# Patient Record
Sex: Male | Born: 1940 | Race: White | Hispanic: No | Smoking: Never smoker
Health system: Southern US, Community
[De-identification: ages and names within clinical notes are randomized; demographics above are authoritative.]

## PROBLEM LIST (undated history)

## (undated) DIAGNOSIS — E785 Hyperlipidemia, unspecified: Secondary | ICD-10-CM

## (undated) HISTORY — PX: APPENDECTOMY: SHX54

---

## 2009-07-04 ENCOUNTER — Encounter (INDEPENDENT_AMBULATORY_CARE_PROVIDER_SITE_OTHER): Payer: Self-pay | Admitting: *Deleted

## 2009-08-08 ENCOUNTER — Ambulatory Visit: Payer: Self-pay | Admitting: Gastroenterology

## 2009-08-16 ENCOUNTER — Telehealth: Payer: Self-pay | Admitting: Gastroenterology

## 2009-08-22 ENCOUNTER — Ambulatory Visit: Payer: Self-pay | Admitting: Gastroenterology

## 2009-08-22 ENCOUNTER — Encounter: Payer: Self-pay | Admitting: Gastroenterology

## 2009-08-23 ENCOUNTER — Encounter: Payer: Self-pay | Admitting: Gastroenterology

## 2012-08-02 ENCOUNTER — Encounter: Payer: Self-pay | Admitting: Gastroenterology

## 2013-02-10 ENCOUNTER — Encounter: Payer: Self-pay | Admitting: Gastroenterology

## 2015-05-07 ENCOUNTER — Encounter: Payer: Self-pay | Admitting: Gastroenterology

## 2016-08-19 DEATH — deceased

## 2018-01-25 ENCOUNTER — Emergency Department (HOSPITAL_COMMUNITY): Payer: Medicare Other

## 2018-01-25 ENCOUNTER — Other Ambulatory Visit: Payer: Self-pay

## 2018-01-25 ENCOUNTER — Emergency Department (HOSPITAL_COMMUNITY): Payer: Medicare Other | Admitting: Certified Registered Nurse Anesthetist

## 2018-01-25 ENCOUNTER — Ambulatory Visit (HOSPITAL_COMMUNITY)
Admission: EM | Admit: 2018-01-25 | Discharge: 2018-01-25 | Disposition: A | Discharge status: Home / Self Care | Payer: Medicare Other | Attending: Emergency Medicine | Admitting: Emergency Medicine

## 2018-01-25 ENCOUNTER — Encounter (HOSPITAL_COMMUNITY)
Admission: EM | Disposition: A | Discharge status: Home / Self Care | Payer: Self-pay | Source: Home / Self Care | Attending: Emergency Medicine

## 2018-01-25 ENCOUNTER — Encounter (HOSPITAL_COMMUNITY): Payer: Self-pay

## 2018-01-25 DIAGNOSIS — Z79899 Other long term (current) drug therapy: Secondary | ICD-10-CM | POA: Diagnosis not present

## 2018-01-25 DIAGNOSIS — E785 Hyperlipidemia, unspecified: Secondary | ICD-10-CM | POA: Insufficient documentation

## 2018-01-25 DIAGNOSIS — W312XXA Contact with powered woodworking and forming machines, initial encounter: Secondary | ICD-10-CM | POA: Insufficient documentation

## 2018-01-25 DIAGNOSIS — S62635B Displaced fracture of distal phalanx of left ring finger, initial encounter for open fracture: Secondary | ICD-10-CM | POA: Diagnosis not present

## 2018-01-25 DIAGNOSIS — S62631B Displaced fracture of distal phalanx of left index finger, initial encounter for open fracture: Secondary | ICD-10-CM | POA: Diagnosis not present

## 2018-01-25 DIAGNOSIS — S62621B Displaced fracture of medial phalanx of left index finger, initial encounter for open fracture: Secondary | ICD-10-CM | POA: Diagnosis not present

## 2018-01-25 DIAGNOSIS — S62633B Displaced fracture of distal phalanx of left middle finger, initial encounter for open fracture: Secondary | ICD-10-CM | POA: Diagnosis not present

## 2018-01-25 DIAGNOSIS — T148XXA Other injury of unspecified body region, initial encounter: Secondary | ICD-10-CM

## 2018-01-25 HISTORY — PX: MINOR IRRIGATION AND DEBRIDEMENT OF WOUND: SHX6239

## 2018-01-25 HISTORY — DX: Hyperlipidemia, unspecified: E78.5

## 2018-01-25 LAB — I-STAT CHEM 8, ED
BUN: 19 mg/dL (ref 6–20)
CREATININE: 0.9 mg/dL (ref 0.61–1.24)
Calcium, Ion: 1.15 mmol/L (ref 1.15–1.40)
Chloride: 106 mmol/L (ref 101–111)
Glucose, Bld: 108 mg/dL — ABNORMAL HIGH (ref 65–99)
HEMATOCRIT: 45 % (ref 39.0–52.0)
HEMOGLOBIN: 15.3 g/dL (ref 13.0–17.0)
POTASSIUM: 4 mmol/L (ref 3.5–5.1)
SODIUM: 142 mmol/L (ref 135–145)
TCO2: 25 mmol/L (ref 22–32)

## 2018-01-25 SURGERY — MINOR IRRIGATION AND DEBRIDEMENT OF WOUND
Anesthesia: Monitor Anesthesia Care | Site: Hand | Laterality: Left

## 2018-01-25 MED ORDER — SULFAMETHOXAZOLE-TRIMETHOPRIM 800-160 MG PO TABS: 1.0000 | ORAL_TABLET | Freq: Two times a day (BID) | ORAL | 0 refills | Status: DC

## 2018-01-25 MED ORDER — POVIDONE-IODINE 10 % EX SWAB: 2.0000 "application " | Freq: Once | CUTANEOUS | Status: DC

## 2018-01-25 MED ORDER — BUPIVACAINE HCL 0.25 % IJ SOLN: 20.0000 mL | Freq: Once | INTRAMUSCULAR | Status: DC

## 2018-01-25 MED ORDER — FENTANYL CITRATE (PF) 100 MCG/2ML IJ SOLN
50.0000 ug | Freq: Once | INTRAMUSCULAR | Status: AC
  Administered 2018-01-25: 50 ug via INTRAVENOUS
  Filled 2018-01-25: qty 2

## 2018-01-25 MED ORDER — CHLORHEXIDINE GLUCONATE 4 % EX LIQD: 60.0000 mL | Freq: Once | CUTANEOUS | Status: DC

## 2018-01-25 MED ORDER — 0.9 % SODIUM CHLORIDE (POUR BTL) OPTIME
TOPICAL | Status: DC | PRN
  Administered 2018-01-25 (×2): 1000 mL

## 2018-01-25 MED ORDER — FENTANYL CITRATE (PF) 100 MCG/2ML IJ SOLN: 25.0000 ug | INTRAMUSCULAR | Status: DC | PRN

## 2018-01-25 MED ORDER — PROPOFOL 1000 MG/100ML IV EMUL
INTRAVENOUS | Status: AC
  Filled 2018-01-25: qty 100

## 2018-01-25 MED ORDER — HYDROCODONE-ACETAMINOPHEN 5-325 MG PO TABS: ORAL_TABLET | ORAL | 0 refills | Status: DC

## 2018-01-25 MED ORDER — ONDANSETRON HCL 4 MG/2ML IJ SOLN
INTRAMUSCULAR | Status: AC
  Filled 2018-01-25: qty 2

## 2018-01-25 MED ORDER — FENTANYL CITRATE (PF) 250 MCG/5ML IJ SOLN
INTRAMUSCULAR | Status: AC
  Filled 2018-01-25: qty 5

## 2018-01-25 MED ORDER — SODIUM CHLORIDE 0.9 % IV SOLN: 1.5000 g | Freq: Three times a day (TID) | INTRAVENOUS | Status: DC

## 2018-01-25 MED ORDER — PROPOFOL 500 MG/50ML IV EMUL
INTRAVENOUS | Status: DC | PRN
  Administered 2018-01-25: 100 ug/kg/min via INTRAVENOUS

## 2018-01-25 MED ORDER — SODIUM CHLORIDE 0.9 % IV SOLN
1.5000 g | Freq: Once | INTRAVENOUS | Status: AC
  Administered 2018-01-25: 1.5 g via INTRAVENOUS
  Filled 2018-01-25: qty 1.5

## 2018-01-25 MED ORDER — LIDOCAINE-EPINEPHRINE (PF) 1.5 %-1:200000 IJ SOLN
INTRAMUSCULAR | Status: DC | PRN
  Administered 2018-01-25: 10 mL via PERINEURAL

## 2018-01-25 MED ORDER — FENTANYL CITRATE (PF) 250 MCG/5ML IJ SOLN
INTRAMUSCULAR | Status: DC | PRN
  Administered 2018-01-25: 50 ug via INTRAVENOUS

## 2018-01-25 MED ORDER — LIDOCAINE HCL 2 % IJ SOLN
20.0000 mL | Freq: Once | INTRAMUSCULAR | Status: AC
  Administered 2018-01-25: 400 mg
  Filled 2018-01-25: qty 20

## 2018-01-25 MED ORDER — LACTATED RINGERS IV SOLN
INTRAVENOUS | Status: DC | PRN
  Administered 2018-01-25: 18:00:00 via INTRAVENOUS

## 2018-01-25 MED ORDER — LACTATED RINGERS IV SOLN
INTRAVENOUS | Status: DC
  Administered 2018-01-25: 18:00:00 via INTRAVENOUS

## 2018-01-25 MED ORDER — DEXAMETHASONE SODIUM PHOSPHATE 10 MG/ML IJ SOLN
INTRAMUSCULAR | Status: AC
  Filled 2018-01-25: qty 1

## 2018-01-25 MED ORDER — PROMETHAZINE HCL 25 MG/ML IJ SOLN: 6.2500 mg | INTRAMUSCULAR | Status: DC | PRN

## 2018-01-25 MED ORDER — BUPIVACAINE HCL (PF) 0.25 % IJ SOLN
INTRAMUSCULAR | Status: AC
  Filled 2018-01-25: qty 10

## 2018-01-25 MED ORDER — PROPOFOL 10 MG/ML IV BOLUS
INTRAVENOUS | Status: AC
  Filled 2018-01-25: qty 20

## 2018-01-25 MED ORDER — ONDANSETRON HCL 4 MG/2ML IJ SOLN
INTRAMUSCULAR | Status: DC | PRN
  Administered 2018-01-25: 4 mg via INTRAVENOUS

## 2018-01-25 MED ORDER — PROPOFOL 10 MG/ML IV BOLUS
INTRAVENOUS | Status: DC | PRN
  Administered 2018-01-25 (×2): 10 mg via INTRAVENOUS

## 2018-01-25 MED ORDER — ROPIVACAINE HCL 7.5 MG/ML IJ SOLN
INTRAMUSCULAR | Status: DC | PRN
  Administered 2018-01-25: 30 mL via PERINEURAL

## 2018-01-25 MED ORDER — BUPIVACAINE HCL (PF) 0.25 % IJ SOLN
20.0000 mL | Freq: Once | INTRAMUSCULAR | Status: AC
  Administered 2018-01-25: 20 mL
  Filled 2018-01-25: qty 30

## 2018-01-25 MED ORDER — DEXAMETHASONE SODIUM PHOSPHATE 10 MG/ML IJ SOLN
INTRAMUSCULAR | Status: DC | PRN
  Administered 2018-01-25: 4 mg via INTRAVENOUS

## 2018-01-25 SURGICAL SUPPLY — 60 items
BANDAGE ACE 3X5.8 VEL STRL LF (GAUZE/BANDAGES/DRESSINGS) ×3 IMPLANT
BANDAGE ACE 4X5 VEL STRL LF (GAUZE/BANDAGES/DRESSINGS) ×3 IMPLANT
BANDAGE COBAN STERILE 2 (GAUZE/BANDAGES/DRESSINGS) IMPLANT
BANDAGE ELASTIC 3 VELCRO ST LF (GAUZE/BANDAGES/DRESSINGS) ×3 IMPLANT
BANDAGE ELASTIC 4 VELCRO ST LF (GAUZE/BANDAGES/DRESSINGS) ×3 IMPLANT
BENZOIN TINCTURE PRP APPL 2/3 (GAUZE/BANDAGES/DRESSINGS) ×3 IMPLANT
BLADE CLIPPER SURG (BLADE) ×3 IMPLANT
BNDG ESMARK 4X9 LF (GAUZE/BANDAGES/DRESSINGS) ×3 IMPLANT
BNDG GAUZE ELAST 4 BULKY (GAUZE/BANDAGES/DRESSINGS) ×3 IMPLANT
CHLORAPREP W/TINT 26ML (MISCELLANEOUS) IMPLANT
CLOSURE WOUND 1/2 X4 (GAUZE/BANDAGES/DRESSINGS) ×1
CORDS BIPOLAR (ELECTRODE) ×3 IMPLANT
COVER SURGICAL LIGHT HANDLE (MISCELLANEOUS) ×3 IMPLANT
CUFF TOURNIQUET SINGLE 18IN (TOURNIQUET CUFF) ×3 IMPLANT
CUFF TOURNIQUET SINGLE 24IN (TOURNIQUET CUFF) IMPLANT
DRAPE C-ARM MINI 42X72 WSTRAPS (DRAPES) ×3 IMPLANT
DRAPE OEC MINIVIEW 54X84 (DRAPES) ×3 IMPLANT
DRAPE SURG 17X23 STRL (DRAPES) ×3 IMPLANT
DRSG ADAPTIC 3X8 NADH LF (GAUZE/BANDAGES/DRESSINGS) ×3 IMPLANT
DRSG EMULSION OIL 3X3 NADH (GAUZE/BANDAGES/DRESSINGS) ×3 IMPLANT
GAUZE SPONGE 4X4 12PLY STRL (GAUZE/BANDAGES/DRESSINGS) ×3 IMPLANT
GAUZE XEROFORM 1X8 LF (GAUZE/BANDAGES/DRESSINGS) ×3 IMPLANT
GAUZE XEROFORM 5X9 LF (GAUZE/BANDAGES/DRESSINGS) ×3 IMPLANT
GLOVE BIO SURGEON STRL SZ7.5 (GLOVE) ×3 IMPLANT
GLOVE BIOGEL PI IND STRL 8 (GLOVE) ×1 IMPLANT
GLOVE BIOGEL PI INDICATOR 8 (GLOVE) ×2
GOWN STRL REUS W/ TWL LRG LVL3 (GOWN DISPOSABLE) ×3 IMPLANT
GOWN STRL REUS W/TWL LRG LVL3 (GOWN DISPOSABLE) ×6
K-WIRE 0.9X102 (Wire) ×3 IMPLANT
KIT BASIN OR (CUSTOM PROCEDURE TRAY) ×3 IMPLANT
KIT TURNOVER KIT B (KITS) ×3 IMPLANT
KWIRE 0.9X102 (Wire) ×1 IMPLANT
MANIFOLD NEPTUNE II (INSTRUMENTS) IMPLANT
NEEDLE HYPO 25GX1X1/2 BEV (NEEDLE) IMPLANT
NS IRRIG 1000ML POUR BTL (IV SOLUTION) ×3 IMPLANT
PACK ORTHO EXTREMITY (CUSTOM PROCEDURE TRAY) ×3 IMPLANT
PAD ARMBOARD 7.5X6 YLW CONV (MISCELLANEOUS) ×6 IMPLANT
PAD CAST 3X4 CTTN HI CHSV (CAST SUPPLIES) ×1 IMPLANT
PAD CAST 4YDX4 CTTN HI CHSV (CAST SUPPLIES) ×1 IMPLANT
PADDING CAST ABS 3INX4YD NS (CAST SUPPLIES) ×2
PADDING CAST ABS COTTON 3X4 (CAST SUPPLIES) ×1 IMPLANT
PADDING CAST COTTON 3X4 STRL (CAST SUPPLIES) ×2
PADDING CAST COTTON 4X4 STRL (CAST SUPPLIES) ×2
SLING ARM FOAM STRAP LRG (SOFTGOODS) ×3 IMPLANT
SPLINT PLASTER CAST XFAST 3X15 (CAST SUPPLIES) ×1 IMPLANT
SPLINT PLASTER XTRA FASTSET 3X (CAST SUPPLIES) ×2
STRIP CLOSURE SKIN 1/2X4 (GAUZE/BANDAGES/DRESSINGS) ×2 IMPLANT
SUCTION FRAZIER HANDLE 10FR (MISCELLANEOUS) ×2
SUCTION TUBE FRAZIER 10FR DISP (MISCELLANEOUS) ×1 IMPLANT
SUT CHROMIC 6 0 PS 4 (SUTURE) ×6 IMPLANT
SUT ETHILON 4 0 P 3 18 (SUTURE) IMPLANT
SUT MON AB 5-0 P3 18 (SUTURE) ×6 IMPLANT
SUT PROLENE 4 0 P 3 18 (SUTURE) IMPLANT
SYR CONTROL 10ML LL (SYRINGE) IMPLANT
TOWEL OR 17X24 6PK STRL BLUE (TOWEL DISPOSABLE) ×3 IMPLANT
TOWEL OR 17X26 10 PK STRL BLUE (TOWEL DISPOSABLE) ×3 IMPLANT
TUBE CONNECTING 12'X1/4 (SUCTIONS) ×1
TUBE CONNECTING 12X1/4 (SUCTIONS) ×2 IMPLANT
TUBE FEEDING ENTERAL 5FR 16IN (TUBING) IMPLANT
WATER STERILE IRR 1000ML POUR (IV SOLUTION) ×3 IMPLANT

## 2018-01-25 NOTE — ED Provider Notes (Signed)
[facility] EMERGENCY DEPARTMENT Provider Note   CSN: [id number] Arrival date & time: 01/25/18  1222     History   Chief Complaint Chief Complaint  Patient presents with  . Finger Injury    HPI Dennis Osborn is a 77 y.o. male.  HPI   53 YOM presents today with injury to his left hand.  He is right-hand dominant he was using a router when his hand got caught causing lacerations to the second third and fourth digits.  He notes open wounds to these fingers.  He notes his last tetanus was approximately a year ago, his last oral intake was this morning at 8 AM noting he had coffee and biscuits.  He reports he is not on blood thinners.   Past Medical History:  Diagnosis Date  . Hyperlipidemia     There are no active problems to display for this patient.   History reviewed. No pertinent surgical history.      Home Medications    Prior to Admission medications   Not on File    Family History No family history on file.  Social History Social History   Tobacco Use  . Smoking status: Not on file  Substance Use Topics  . Alcohol use: Not on file  . Drug use: Not on file     Allergies   Patient has no known allergies.   Review of Systems Review of Systems  All other systems reviewed and are negative.   Physical Exam Updated Vital Signs BP (!) 178/69 (BP Location: Right Arm)   Pulse (!) 59   Temp 98.1 F (36.7 C) (Oral)   Resp 16   SpO2 100%   Physical Exam  Constitutional: He is oriented to person, place, and time. He appears well-developed and well-nourished.  HENT:  Head: Normocephalic and atraumatic.  Eyes: Pupils are equal, round, and reactive to light. Conjunctivae are normal. Right eye exhibits no discharge. Left eye exhibits no discharge. No scleral icterus.  Neck: Normal range of motion. No JVD present. No tracheal deviation present.  Pulmonary/Chest: Effort normal. No stridor.  Neurological: He is alert and oriented to  person, place, and time. Coordination normal.  Psychiatric: He has a normal mood and affect. His behavior is normal. Judgment and thought content normal.  Nursing note and vitals reviewed.         ED Treatments / Results  Labs (all labs ordered are listed, but only abnormal results are displayed) Labs Reviewed  I-STAT CHEM 8, ED - Abnormal; Notable for the following components:      Result Value   Glucose, Bld 108 (*)    All other components within normal limits    EKG None  Radiology Dg Hand Complete Left  Result Date: 01/25/2018 CLINICAL DATA:  77 year old male status post saw injury to left hand today. EXAM: LEFT HAND - COMPLETE 3+ VIEW COMPARISON:  None. FINDINGS: Dressing material about the 2nd through 4th fingers. There are oblique probably mildly comminuted fractures through the tuft of the 4th distal phalanx, and through the base of the 2nd distal phalanx which is intra-articular (image 3). Both fractures are mildly displaced. No definite fracture through the 3rd phalanges. And the 2nd and 4th middle and proximal phalanges appear to remain intact. The metacarpals, carpals, and distal radius and ulna appear intact. IMPRESSION: 1. Oblique intra-articular fracture through the base of the left 2nd distal phalanx with mild displacement. The head of the 2nd middle phalanx appears to remain  intact. 2. Oblique mildly comminuted fracture through the tuft of the left 4th distal phalanx. 3. No other osseous injury identified. Electronically Signed   By: Odessa FlemingH  Hall M.D.   On: 01/25/2018 14:15    Procedures Procedures (including critical care time)  Medications Ordered in ED Medications  ampicillin-sulbactam (UNASYN) 1.5 g in sodium chloride 0.9 % 100 mL IVPB (1.5 g Intravenous New Bag/Given 01/25/18 1644)  ampicillin-sulbactam (UNASYN) 1.5 g in sodium chloride 0.9 % 100 mL IVPB (has no administration in time range)  lidocaine (XYLOCAINE) 2 % (with pres) injection 400 mg (400 mg Infiltration  Given by Other 01/25/18 1514)  bupivacaine (PF) (MARCAINE) 0.25 % injection 20 mL (20 mLs Infiltration Given by Other 01/25/18 1515)  fentaNYL (SUBLIMAZE) injection 50 mcg (50 mcg Intravenous Given 01/25/18 1639)     Initial Impression / Assessment and Plan / ED Course  I have reviewed the triage vital signs and the nursing notes.  Pertinent labs & imaging results that were available during my care of the patient were reviewed by me and considered in my medical decision making (see chart for details).      Final Clinical Impressions(s) / ED Diagnoses   Final diagnoses:  Open fracture    Labs: I-STAT Chem-8  Imaging: DG hand complete left  Consults:  Therapeutics: Unasyn, fentanyl  Discharge Meds:   Assessment/Plan: 77 year old male presents today with open fractures to his fingers.  Wounds were cleansed, tetanus updated, prophylactic antibiotics given.  Hand surgery was consulted who will take patient to the OR for management.     ED Discharge Orders    None       Rosalio LoudHedges, Andreya Lacks, PA-C 01/25/18 1702    Mancel BaleWentz, Elliott, MD 01/25/18 2155

## 2018-01-25 NOTE — Anesthesia Preprocedure Evaluation (Addendum)
Anesthesia Evaluation  Patient identified by MRN, date of birth, ID band Patient awake    Reviewed: Allergy & Precautions, NPO status , Patient's Chart, lab work & pertinent test results  History of Anesthesia Complications Negative for: history of anesthetic complications  Airway Mallampati: II  TM Distance: >3 FB Neck ROM: Full    Dental  (+) Caps, Dental Advisory Given   Pulmonary neg pulmonary ROS,    Pulmonary exam normal        Cardiovascular negative cardio ROS Normal cardiovascular exam     Neuro/Psych negative neurological ROS  negative psych ROS   GI/Hepatic negative GI ROS, Neg liver ROS,   Endo/Other  negative endocrine ROS  Renal/GU negative Renal ROS     Musculoskeletal   Abdominal   Peds  Hematology   Anesthesia Other Findings   Reproductive/Obstetrics                            Anesthesia Physical Anesthesia Plan  ASA: II and emergent  Anesthesia Plan: General and MAC   Post-op Pain Management:    Induction: Intravenous  PONV Risk Score and Plan: 2 and Ondansetron and Dexamethasone  Airway Management Planned: LMA, Natural Airway and Simple Face Mask  Additional Equipment:   Intra-op Plan:   Post-operative Plan: Extubation in OR  Informed Consent:   Dental advisory given  Plan Discussed with: CRNA and Anesthesiologist  Anesthesia Plan Comments:        Anesthesia Quick Evaluation

## 2018-01-25 NOTE — ED Notes (Signed)
ED Provider at bedside. 

## 2018-01-25 NOTE — Progress Notes (Signed)
Pharmacy Antibiotic Note  Dennis Osborn is a 77 y.o. male admitted on 01/25/2018 with open fracture prophylaxis.  Pharmacy has been consulted for Unasyn dosing. He is planned to have surgery likely today with possible discharge after. SCr wnl.   Plan: -Unasyn 1.5 gm IV q 8 hours. Recommend d/c within 24 hours post op.      Temp (24hrs), Avg:98.1 F (36.7 C), Min:98.1 F (36.7 C), Max:98.1 F (36.7 C)  No results for input(s): WBC, CREATININE, LATICACIDVEN, VANCOTROUGH, VANCOPEAK, VANCORANDOM, GENTTROUGH, GENTPEAK, GENTRANDOM, TOBRATROUGH, TOBRAPEAK, TOBRARND, AMIKACINPEAK, AMIKACINTROU, AMIKACIN in the last 168 hours.  CrCl cannot be calculated (No order found.).    No Known Allergies  Thank you for allowing pharmacy to be a part of this patient's care.  Dennis Osborn, PharmD., BCPS Clinical Pharmacist Clinical phone for 01/25/18 until 11pm: [phone] If after 11pm, please call main pharmacy at: [phone]

## 2018-01-25 NOTE — Transfer of Care (Signed)
Immediate Anesthesia Transfer of Care Note  Patient: CAELUM FEDERICI  Procedure(s) Performed: LEFT INDEX/LONG/RING FINGER IRRIGATION AND DEBRIDEMENT, REPAIR LACERATIONS, PINNING OF FRACTURES (Left Hand)  Patient Location: PACU  Anesthesia Type:MAC combined with regional for post-op pain  Level of Consciousness: awake, alert , oriented and patient cooperative  Airway & Oxygen Therapy: Patient Spontanous Breathing and Patient connected to nasal cannula oxygen  Post-op Assessment: Report given to RN and Post -op Vital signs reviewed and stable  Post vital signs: Reviewed and stable  Last Vitals:  Vitals Value Taken Time  BP    Temp 36.4 C 01/25/2018  8:00 PM  Pulse 57 01/25/2018  8:01 PM  Resp 14 01/25/2018  8:01 PM  SpO2 97 % 01/25/2018  8:01 PM  Vitals shown include unvalidated device data.  Last Pain:  Vitals:   01/25/18 1639  TempSrc:   PainSc: 5          Complications: No apparent anesthesia complications

## 2018-01-25 NOTE — Discharge Instructions (Signed)

## 2018-01-25 NOTE — Anesthesia Postprocedure Evaluation (Signed)
Anesthesia Post Note  Patient: [name]  Procedure(s) Performed: LEFT INDEX/LONG/RING FINGER IRRIGATION AND DEBRIDEMENT, REPAIR LACERATIONS, PINNING OF FRACTURES (Left Hand)     Patient location during evaluation: PACU Anesthesia Type: MAC and Regional Level of consciousness: awake and alert Pain management: pain level controlled Vital Signs Assessment: post-procedure vital signs reviewed and stable Respiratory status: spontaneous breathing, nonlabored ventilation, respiratory function stable and patient connected to nasal cannula oxygen Cardiovascular status: stable and blood pressure returned to baseline Postop Assessment: no apparent nausea or vomiting Anesthetic complications: no    Last Vitals:  Vitals:   01/25/18 2015 01/25/18 2030  BP: 112/79 124/68  Pulse: (!) 48 (!) 49  Resp: 13 12  Temp:    SpO2: 99% 97%    Last Pain:  Vitals:   01/25/18 2030  TempSrc:   PainSc: 0-No pain                 Christene Pounds

## 2018-01-25 NOTE — Op Note (Signed)
[id number] 

## 2018-01-25 NOTE — ED Notes (Signed)
Wife at bedside given pt watch and gold necklace. Also given pt belonging in bag.

## 2018-01-25 NOTE — Brief Op Note (Signed)
01/25/2018  7:59 PM  PATIENT:  Reola Calkins  77 y.o. male  PRE-OPERATIVE DIAGNOSIS:  ROUTER INJURY  POST-OPERATIVE DIAGNOSIS:  ROUTER INJURY  PROCEDURE:  Procedure(s): LEFT INDEX/LONG/RING FINGER IRRIGATION AND DEBRIDEMENT, REPAIR LACERATIONS, PINNING OF FRACTURES (Left)  SURGEON:  Surgeon(s) and Role:    * Leanora Cover, MD - Primary  PHYSICIAN ASSISTANT:   ASSISTANTS: none   ANESTHESIA:   regional and MAC  EBL:  20 mL   BLOOD ADMINISTERED:none  DRAINS: none   LOCAL MEDICATIONS USED:  NONE  SPECIMEN:  No Specimen  DISPOSITION OF SPECIMEN:  NA  COUNTS:  YES  TOURNIQUET:   Total Tourniquet Time Documented: Upper Arm (Left) - 65 minutes Total: Upper Arm (Left) - 65 minutes   DICTATION: .Other Dictation: Dictation Number [id number]  PLAN OF CARE: Discharge to home after PACU  PATIENT DISPOSITION:  PACU - hemodynamically stable.

## 2018-01-25 NOTE — H&P (Signed)
Aviva SignsJames R Marty is an 77 y.o. male.   Chief Complaint: left hand router injury HPI: 77 yo rhd male present with wife states he injured left hand in router earlier today.  Reports no previous injury to left hand and no other injury at this time.  He notes a burning pain of 4/10 in left index finger.  Alleviated with rest and aggravated with motion.  Associated bleeding.  Case discussed with Burna FortsJeff Hedges, [facility] and his note from 01/25/2018 reviewed. Xrays viewed and interpreted by me: 3 views left hand show distal phalanx fractures of index, long, ring fingers with intraarticular involvement of index finger fracture. Labs reviewed: below  Allergies: No Known Allergies  Past Medical History:  Diagnosis Date  . Hyperlipidemia     History reviewed. No pertinent surgical history.  Family History: No family history on file.  Social History:   has no tobacco, alcohol, and drug history on file.  Medications: Medications Prior to Admission  Medication Sig Dispense Refill  . Cholecalciferol (VITAMIN D3 PO) Take 1 tablet by mouth daily.    Marland Kitchen. donepezil (ARICEPT) 10 MG tablet Take 10 mg by mouth daily.    . Multiple Vitamin (MULTIVITAMIN WITH MINERALS) TABS tablet Take 1 tablet by mouth daily.    Marland Kitchen. OVER THE COUNTER MEDICATION Take 1 tablet by mouth daily. [facility]    . simvastatin (ZOCOR) 80 MG tablet Take 80 mg by mouth daily.      Results for orders placed or performed during the hospital encounter of 01/25/18 (from the past 48 hour(s))  I-Stat Chem 8, ED     Status: Abnormal   Collection Time: 01/25/18  4:43 PM  Result Value Ref Range   Sodium 142 135 - 145 mmol/L   Potassium 4.0 3.5 - 5.1 mmol/L   Chloride 106 101 - 111 mmol/L   BUN 19 6 - 20 mg/dL   Creatinine, Ser 1.610.90 0.61 - 1.24 mg/dL   Glucose, Bld 096108 (H) 65 - 99 mg/dL   Calcium, Ion 0.451.15 4.091.15 - 1.40 mmol/L   TCO2 25 22 - 32 mmol/L   Hemoglobin 15.3 13.0 - 17.0 g/dL   HCT 81.145.0 91.439.0 - 78.252.0 %    Dg Hand Complete Left  Result  Date: 01/25/2018 CLINICAL DATA:  77 year old male status post saw injury to left hand today. EXAM: LEFT HAND - COMPLETE 3+ VIEW COMPARISON:  None. FINDINGS: Dressing material about the 2nd through 4th fingers. There are oblique probably mildly comminuted fractures through the tuft of the 4th distal phalanx, and through the base of the 2nd distal phalanx which is intra-articular (image 3). Both fractures are mildly displaced. No definite fracture through the 3rd phalanges. And the 2nd and 4th middle and proximal phalanges appear to remain intact. The metacarpals, carpals, and distal radius and ulna appear intact. IMPRESSION: 1. Oblique intra-articular fracture through the base of the left 2nd distal phalanx with mild displacement. The head of the 2nd middle phalanx appears to remain intact. 2. Oblique mildly comminuted fracture through the tuft of the left 4th distal phalanx. 3. No other osseous injury identified. Electronically Signed   By: Odessa FlemingH  Hall M.D.   On: 01/25/2018 14:15     A comprehensive review of systems was negative. Review of Systems: No fevers, chills, night sweats, chest pain, shortness of breath, nausea, vomiting, diarrhea, constipation, easy bleeding or bruising, headaches, dizziness, vision changes, fainting.   Blood pressure (!) 178/69, pulse (!) 59, temperature 98.1 F (36.7 C), temperature source Oral,  resp. rate 16, SpO2 100 %.  General appearance: alert, cooperative and appears stated age Head: Normocephalic, without obvious abnormality, atraumatic Neck: supple, symmetrical, trachea midline Resp: clear to auscultation bilaterally Cardio: regular rate and rhythm Extremities: Intact sensation and capillary refill all digits.  +epl/fpl/io.  Left index long and ring fingers with lacerations involving the nail.  Ring finger with some lateral tissue loss.  Index with wound through nail and dorsal wound on middle phalanx.  He is able to feel all fingers he states and has intact capillary  refill.   Pulses: 2+ and symmetric Skin: Skin color, texture, turgor normal. No rashes or lesions Neurologic: Grossly normal Incision/Wound: As above  Assessment/Plan  Recommend OR for irrigation and debridement and exploration of wounds.  Plan pin fixation of index finger.  Risks, benefits and alternatives of surgery were discussed including risks of blood loss, infection, damage to nerves/vessels/tendons/ligament/bone, failure of surgery, need for additional surgery, complication with wound healing, nonunion, malunion, stiffness.  He voiced understanding of these risks and elected to proceed.    Marlayna Bannister R 01/25/2018, 5:25 PM

## 2018-01-25 NOTE — Anesthesia Procedure Notes (Signed)
Anesthesia Regional Block: Axillary brachial plexus block   Pre-Anesthetic Checklist: ,, timeout performed, Correct Patient, Correct Site, Correct Laterality, Correct Procedure, Correct Position, site marked, Risks and benefits discussed,  Surgical consent,  Pre-op evaluation,  At surgeon's request and post-op pain management  Laterality: Upper and Left  Prep: chloraprep       Needles:  Injection technique: Single-shot  Needle Type: Echogenic Needle          Additional Needles:   Narrative:  Start time: 01/25/2018 6:15 PM End time: 01/25/2018 6:20 PM Injection made incrementally with aspirations every 5 mL.  Performed by: Personally   Additional Notes: H+P and labs reviewed, risks and benefits discussed with patient, procedure tolerated well without complications

## 2018-01-25 NOTE — ED Triage Notes (Signed)
Pt presents for evaluation of finger injury to first, second, and third fingers on L hand from table saw today. Bleeding controlled. Good PMS to fingers/hand.

## 2018-01-26 ENCOUNTER — Encounter (HOSPITAL_COMMUNITY): Payer: Self-pay | Admitting: Orthopedic Surgery

## 2018-01-26 NOTE — Op Note (Addendum)
NAME:  Dennis Osborn, Dennis Osborn NO.:  [id number]  MEDICAL RECORD NO.:  [id number]  LOCATION:  [facility]                         FACILITY:  [facility]  PHYSICIAN:  Leanora Cover, MD        DATE OF BIRTH:  Dec 26, 1940  DATE OF PROCEDURE:  01/25/2018 DATE OF DISCHARGE:                              OPERATIVE REPORT   PREOPERATIVE DIAGNOSIS:  Left index long and ring finger router injury.  POSTOPERATIVE DIAGNOSIS:  Left index finger open intra-articular middle and distal phalangeal fractures, nail bed laceration and skin loss, left long finger open distal phalanx fracture with nail bed laceration and left ring finger open distal phalanx fracture with nail bed and skin lacerations with skin loss.  PROCEDURE:   1. Left index finger irrigation and debridement of open distal phalanx and middle phalangeal intra-articular fractures including skin and subcutaneous tissues and bone.   2. Open reduction and pin fixation of left index finger distal phalanx and middle phalanx intra-articular Fractures 3. Repair of left index finger skin and nail bed lacerations 4. Left long finger irrigation and debridement of open distal phalanx fracture  5. Left long finger repair of skin and nail bed lacerations  6. Left ring finger irrigation and debridement of open distal phalanx fracture  7. Left ring finger repair of skin and nail bed laceration.  SURGEON:  Leanora Cover, MD.  ASSISTANT:  None.  ANESTHESIA:  Regional with MAC.  IV FLUIDS:  Per Anesthesia flow sheet.  ESTIMATED BLOOD LOSS:  Minimal.  COMPLICATIONS:  None.  SPECIMENS:  None.  TOURNIQUET TIME:  65 minutes.  DISPOSITION:  Stable to PACU.  INDICATIONS:  Dennis Osborn is a 77 year old male who states he injured his left index long and ring fingers in a router earlier today.  He was seen in the emergency department where radiographs were taken revealing distal phalanx fractures of the index, long, and ring fingers.  I recommended  irrigation and debridement with fixation of the index finger and repair of skin and nail bed lacerations in the operating room. Risks, benefits, and alternatives of surgery were discussed including risk of blood loss; infection; damage to nerves, vessels, tendons, ligaments, bone; failure of surgery; need for additional surgery; complications with wound healing; continued pain; nonunion; malunion; and stiffness.  He voiced understanding of these risks and elected to proceed.  OPERATIVE COURSE:  After being identified preoperatively by myself, the patient and I agreed upon procedure and site of procedure.  Surgical site was marked.  The risks, benefits, and alternatives of surgery were reviewed and he wished to proceed.  Surgical consent had been signed. He had been given IV antibiotics on his way up from the emergency department.  A regional block was performed by Anesthesia in preoperative holding.  He was transported to the operating room and placed on the operating room table in supine position with the left upper extremity on arm board.  MAC anesthesia was induced.  Left upper extremity was prepped and draped in a normal sterile orthopedic fashion. Surgical pause was performed between surgeons, Anesthesia, and operating room staff; and all were in agreement as to the patient, procedure, and site of  procedure.  The wounds were explored.  The long and ring fingers had lacerations through the nail bed as well as distal phalanx fractures underneath.  These were tuft-type fractures.  There was no gross contamination.  The ring finger had some tissue loss at the radial side. In the index finger, there was open distal phalanx intra-articular fracture and open middle phalanx intra-articular fracture of the condyles.  The condylar fractures were impacted and nondisplaced.  The distal phalanx intra-articular fracture was displaced.  There was no gross contamination.  There were multiple  lacerations surrounding the index finger including skin loss at the dorsal ulnar aspect.  There was nail bed laceration.  The flap of skin on the volar radial aspect of the finger was lifted.  The subcutaneous tissues were largely intact.  No visible neurovascular injury was noted.  The arm was then exsanguinated and the tourniquet inflated to 250 mmHg after exsanguination of the limb with an Esmarch bandage.  The wounds were copiously irrigated with sterile saline including the open fractures.  A knife blade was used to sharply excise any devitalized tissue including skin and subcutaneous tissues of the index finger and 2 fragments of bone that were devitalized.  The open fracture of the left long finger was debrided including skin subcutaneous tissues and tissue adjacent to bone.  The open fracture of the left ring finger was debrided including skin subcutaneous tissues and tissue adjacent to bone.  Once the wounds had been copiously irrigated and debrided, a 5-0 Monocryl suture was used to reapproximate skin edges as best possible.  There was loss of tissue on the radial side of the ring finger, which would be left to granulate.  There was no exposed bone in this portion of the wound.  The nails of the index long and ring fingers had been removed with a Soil scientist.  A 6-0 chromic suture was used to reapproximate nail bed tissue.  Nailbed tissue was reapproximated in the index finger, long finger, and ring fingers with the 6-0 chromic suture.  A portion of the index finger wounds were closed.  The dorsal wound had a thin skin flap which was left intact and tacked down with a 6-0 chromic suture.  There was subcutaneous tissues remaining that we were able to cover the extensor tendon which was also tacked down with the 6-0 chromic suture.  This would be allowed to granulate in.  The C- arm was brought in.  A reduction of the open distal phalanx intra- articular fracture was performed.  A  0.035-inch K-wires were advanced from the tip of the finger across the DIP joint and into the middle phalanx crossing that fracture as well.  An additional K-wire was later advanced dorsally to trap the dorsal fragment in place.  Good reduction and position of the pins were obtained.  The pins were bent and cut short.  The remaining portions of skin laceration of the index finger were repaired.  The nail bed laceration of the index finger had been repaired with a 6-0 chromic suture as well.  A piece of Xeroform was placed in all nail fold, and all wounds dressed with sterile Xeroform, 4x4s, and wrapped with a Kerlix bandage.  A volar splint was placed and wrapped with Kerlix and Ace bandage.  Tourniquet was deflated at 65 minutes.  Fingertips were pink with brisk capillary refill after deflation of the tourniquet.  The operative drapes were broken down. The patient was awoken from anesthesia safely.  He  was transferred back to stretcher and taken to PACU in stable condition.  I will see him back in the office in 1 week for postoperative followup.  I will give him a prescription for Norco 5/325 one to two p.o. q.6 hours p.r.n. pain, dispensed #20 and Bactrim DS one p.o. b.i.d. x7 days.     Leanora Cover, MD     KK/MEDQ  D:  01/25/2018  T:  01/26/2018  Job:  [id number]  Addendum (02/18/18): clarify extent of procedure.

## 2019-05-26 ENCOUNTER — Encounter (HOSPITAL_COMMUNITY): Payer: Self-pay

## 2019-05-26 ENCOUNTER — Ambulatory Visit (HOSPITAL_COMMUNITY): Admission: RE | Admit: 2019-05-26 | Payer: Medicare Other | Source: Ambulatory Visit

## 2019-05-26 ENCOUNTER — Other Ambulatory Visit: Payer: Self-pay

## 2019-06-14 ENCOUNTER — Other Ambulatory Visit (HOSPITAL_COMMUNITY): Payer: Self-pay | Admitting: Internal Medicine

## 2019-06-14 ENCOUNTER — Other Ambulatory Visit: Payer: Self-pay | Admitting: Internal Medicine

## 2019-06-14 ENCOUNTER — Ambulatory Visit (HOSPITAL_COMMUNITY)
Admission: RE | Admit: 2019-06-14 | Discharge: 2019-06-14 | Disposition: A | Discharge status: Home / Self Care | Payer: Medicare Other | Source: Ambulatory Visit | Attending: Internal Medicine | Admitting: Internal Medicine

## 2019-06-14 ENCOUNTER — Other Ambulatory Visit: Payer: Self-pay

## 2019-06-14 DIAGNOSIS — M545 Low back pain, unspecified: Secondary | ICD-10-CM

## 2019-11-10 ENCOUNTER — Ambulatory Visit: Payer: Medicare Other | Attending: Internal Medicine

## 2019-11-10 DIAGNOSIS — Z23 Encounter for immunization: Secondary | ICD-10-CM | POA: Insufficient documentation

## 2019-11-10 NOTE — Progress Notes (Signed)
   Covid-19 Vaccination Clinic  Name:  RIGDON MACOMBER    MRN: [id number] DOB: [birth year]  11/10/2019  Mr. Mihalik was observed post Covid-19 immunization for 15 minutes without incidence. He was provided with Vaccine Information Sheet and instruction to access the V-Safe system.   Mr. Cork was instructed to call 911 with any severe reactions post vaccine: Marland Kitchen Difficulty breathing  . Swelling of your face and throat  . A fast heartbeat  . A bad rash all over your body  . Dizziness and weakness    Immunizations Administered    Name Date Dose VIS Date Route   Pfizer COVID-19 Vaccine 11/10/2019 10:31 AM 0.3 mL 09/29/2019 Intramuscular   Manufacturer: [employer], [employer]   Lot: [id number]   NDC: [address]-3

## 2019-11-13 DIAGNOSIS — M720 Palmar fascial fibromatosis [Dupuytren]: Secondary | ICD-10-CM | POA: Insufficient documentation

## 2019-12-01 ENCOUNTER — Ambulatory Visit: Payer: Medicare Other | Attending: Internal Medicine

## 2019-12-01 DIAGNOSIS — Z23 Encounter for immunization: Secondary | ICD-10-CM

## 2019-12-01 NOTE — Progress Notes (Signed)
   Covid-19 Vaccination Clinic  Name:  Dennis Osborn    MRN: [id number] DOB: [birth year]  12/01/2019  [name] was observed post Covid-19 immunization for 15 minutes without incidence. He was provided with Vaccine Information Sheet and instruction to access the V-Safe system.   [name] was instructed to call 911 with any severe reactions post vaccine: Marland Kitchen Difficulty breathing  . Swelling of your face and throat  . A fast heartbeat  . A bad rash all over your body  . Dizziness and weakness    Immunizations Administered    Name Date Dose VIS Date Route   Pfizer COVID-19 Vaccine 12/01/2019 10:24 AM 0.3 mL 09/29/2019 Intramuscular   Manufacturer: [employer], [employer]   Lot: [id number]   NDC: [address]-1

## 2020-05-22 ENCOUNTER — Ambulatory Visit (HOSPITAL_COMMUNITY)
Admission: RE | Admit: 2020-05-22 | Discharge: 2020-05-22 | Disposition: A | Discharge status: Home / Self Care | Payer: Medicare Other | Source: Ambulatory Visit | Attending: Vascular Surgery | Admitting: Vascular Surgery

## 2020-05-22 ENCOUNTER — Other Ambulatory Visit: Payer: Self-pay

## 2020-05-22 ENCOUNTER — Other Ambulatory Visit: Payer: Self-pay | Admitting: Internal Medicine

## 2020-05-22 ENCOUNTER — Other Ambulatory Visit (HOSPITAL_COMMUNITY): Payer: Self-pay | Admitting: Internal Medicine

## 2020-05-22 DIAGNOSIS — R42 Dizziness and giddiness: Secondary | ICD-10-CM

## 2020-05-22 DIAGNOSIS — G451 Carotid artery syndrome (hemispheric): Secondary | ICD-10-CM | POA: Diagnosis not present

## 2020-05-22 DIAGNOSIS — G3184 Mild cognitive impairment, so stated: Secondary | ICD-10-CM

## 2020-05-26 ENCOUNTER — Ambulatory Visit (HOSPITAL_COMMUNITY)
Admission: RE | Admit: 2020-05-26 | Discharge: 2020-05-26 | Disposition: A | Discharge status: Home / Self Care | Payer: Medicare Other | Source: Ambulatory Visit | Attending: Internal Medicine | Admitting: Internal Medicine

## 2020-05-26 ENCOUNTER — Other Ambulatory Visit: Payer: Self-pay

## 2020-05-26 DIAGNOSIS — G3184 Mild cognitive impairment, so stated: Secondary | ICD-10-CM | POA: Diagnosis present

## 2020-05-26 DIAGNOSIS — R42 Dizziness and giddiness: Secondary | ICD-10-CM | POA: Insufficient documentation

## 2020-05-26 MED ORDER — GADOBUTROL 1 MMOL/ML IV SOLN
7.5000 mL | Freq: Once | INTRAVENOUS | Status: AC | PRN
  Administered 2020-05-26: 7.5 mL via INTRAVENOUS

## 2020-07-01 ENCOUNTER — Ambulatory Visit: Payer: Medicare Other | Admitting: Internal Medicine

## 2020-07-01 ENCOUNTER — Encounter: Payer: Self-pay | Admitting: Internal Medicine

## 2020-07-01 ENCOUNTER — Encounter: Payer: Self-pay | Admitting: *Deleted

## 2020-07-01 VITALS — BP 180/70 | HR 48 | Ht 68.0 in | Wt 158.2 lb

## 2020-07-01 DIAGNOSIS — R001 Bradycardia, unspecified: Secondary | ICD-10-CM

## 2020-07-01 DIAGNOSIS — R42 Dizziness and giddiness: Secondary | ICD-10-CM | POA: Diagnosis not present

## 2020-07-01 DIAGNOSIS — R030 Elevated blood-pressure reading, without diagnosis of hypertension: Secondary | ICD-10-CM

## 2020-07-01 DIAGNOSIS — E785 Hyperlipidemia, unspecified: Secondary | ICD-10-CM | POA: Diagnosis not present

## 2020-07-01 NOTE — Progress Notes (Signed)
Cardiology Office Note:    Date:  07/01/2020   ID:  Dennis Osborn, DOB 19-Sep-1941, MRN [id number]  PCP:  Geoffry Paradise, MD  Cardiologist:  Parke Poisson, MD  Electrophysiologist:  None   Referring MD: Olevia Perches, NP   Chief Complaint/Reason for Referral: Falls and bradycardia  History of Present Illness:    Dennis Osborn is a 79 y.o. male with a history of hyperlipidemia, impaired fasting glucose, osteoarthritis who presents today for evaluation of dizziness and falls.  He has had dizziness and falls for approximately 4 to 5 months.  He is described this for his primary care provider as feeling as if his left leg cannot keep up with his right leg.  Feels his balance and equilibrium are off.  Room is moving around him.  He has been spending quite a bit of time outside during the heat of the summer and feels he is not well hydrated.  Dizziness can occur even if he has not been outside.  He is fallen about 2 times, but usually just sits down on the floor rather than falling from a standing position.  Denies loss of consciousness or striking his head.  Denies chest pain, shortness of breath, palpitations.  Denies PND, orthopnea.  He can go as long as a month without an episode.  These issues have been occurring since mid July.  He notes that when he walks at times he will have left-sided weakness and imbalance where he will fall towards the left.  He then noted some episodes of dizziness.  He was making dinner at the end of July and began to have significant dizziness where he thought he might fall.  Denies presyncope.  Denies lifetime syncope.  EMS came to his house and told him he had an irregular heart rhythm.  He feels the same thing was noted at his primary care provider, though there is only documentation of sinus bradycardia.  He did not go to the hospital after his EMS visit.  Fortunately since the end of July he has not had recurrent dizziness and feels fine.  Never smoker, no  alcohol use.  EKG from primary care provider not included in outside records.  Vital Osborn document a heart rate of 65 bpm.  Orthostatic vital Osborn obtained and were unremarkable.  Medication therapy includes as needed sildenafil, simvastatin 80 mg daily.  He was previously taking 10 mg of donepezil, now cut back to 5 mg due to nightmares.  Past Medical History:  Diagnosis Date  . Hyperlipidemia     Past Surgical History:  Procedure Laterality Date  . APPENDECTOMY    . MINOR IRRIGATION AND DEBRIDEMENT OF WOUND Left 01/25/2018   Procedure: LEFT INDEX/LONG/RING FINGER IRRIGATION AND DEBRIDEMENT, REPAIR LACERATIONS, PINNING OF FRACTURES;  Surgeon: Betha Loa, MD;  Location: [facility] OR;  Service: Orthopedics;  Laterality: Left;    Current Medications: Current Meds  Medication Sig  . Cholecalciferol (VITAMIN D3 PO) Take 1 tablet by mouth daily.  Marland Kitchen donepezil (ARICEPT) 10 MG tablet Take 10 mg by mouth daily.  . Multiple Vitamin (MULTIVITAMIN WITH MINERALS) TABS tablet Take 1 tablet by mouth daily.  . simvastatin (ZOCOR) 80 MG tablet Take 80 mg by mouth daily.  Marland Kitchen sulfamethoxazole-trimethoprim (BACTRIM DS) 800-160 MG tablet Take 1 tablet by mouth 2 (two) times daily.     Allergies:   Patient has no known allergies.   Social History   Tobacco Use  . Smoking status: Never Smoker  .  Smokeless tobacco: Never Used  Substance Use Topics  . Alcohol use: Never  . Drug use: Not on file     Family History: The patient's family history is not on file.  ROS:   Please see the history of present illness.    All other systems reviewed and are negative.  EKGs/Labs/Other Studies Reviewed:    The following studies were reviewed today:  EKG:  Sinus bradycardia, rate 48 bpm.  Recent Labs: No results found for requested labs within last 8760 hours.  Recent Lipid Panel No results found for: CHOL, TRIG, HDL, CHOLHDL, VLDL, LDLCALC, LDLDIRECT  Physical Exam:    VS:  BP (!) 180/70   Pulse (!)  48   Ht  (1.727 m)   Wt 158 lb 3.2 oz (71.8 kg)   SpO2 98%   BMI 24.05 kg/m     Wt Readings from Last 5 Encounters:  07/01/20 158 lb 3.2 oz (71.8 kg)  01/25/18 170 lb (77.1 kg)    Constitutional: No acute distress Eyes: sclera non-icteric, normal conjunctiva and lids ENMT: normal dentition, moist mucous membranes Cardiovascular: regular rhythm, normal rate, no murmurs. S1 and S2 normal. Radial pulses normal bilaterally. No jugular venous distention.  Respiratory: clear to auscultation bilaterally GI : normal bowel sounds, soft and nontender. No distention.   MSK: extremities warm, well perfused. No edema.  NEURO: grossly nonfocal exam, moves all extremities. PSYCH: alert and oriented x 3, normal mood and affect.   ASSESSMENT:    1. Dizziness   2. Bradycardia   3. Elevated BP without diagnosis of hypertension    PLAN:    Dizziness - Plan: EKG 12-Lead, CARDIAC EVENT MONITOR, ECHOCARDIOGRAM COMPLETE  Bradycardia - Plan: EKG 12-Lead, CARDIAC EVENT MONITOR, ECHOCARDIOGRAM COMPLETE  Elevated BP without diagnosis of hypertension  Hyperlipidemia, unspecified hyperlipidemia type   He has episodes of dizziness and is noted to have bradycardia on ECG today.  His wife who is present for the visit today would like a thorough evaluation of cardiovascular causes.  For this we will complete an echocardiogram looking for structural causes of bradycardia and dizziness, and will also complete a 30-day event monitor.  If there are episodes of AV block, A. fib, or pauses or profound bradycardia, we will have him return for follow-up sooner.  No clear-cut offending agents on his medication list.  I do question whether donepezil may be playing a role, but his wife is hesitant to stop this medication due to significant concerns about memory.  Patient appears cognitively well-preserved.  He has an elevated blood pressure reading today due to issues with the elevator on his way to the office, per  his report.  If his blood pressure is elevated at his next visit we will discuss medical therapy.  This can also be reviewed with his primary care provider.    Hyperlipidemia-patient takes simvastatin 80 mg daily.  Lipid panel is optimized.  No changes needed at this time.  Total time of encounter: 45 minutes total time of encounter, including 30 minutes spent in face-to-face patient care on the date of this encounter. This time includes coordination of care and counseling regarding above mentioned problem list. Remainder of non-face-to-face time involved reviewing chart documents/testing relevant to the patient encounter and documentation in the medical record. I have independently reviewed documentation from referring provider.   Weston Brass, MD [place]  [facility]    Medication Adjustments/Labs and Tests Ordered: Current medicines are reviewed at length with the patient today.  Concerns regarding medicines are outlined above.  No orders of the defined types were placed in this encounter.  No orders of the defined types were placed in this encounter.   Patient Instructions  Medication Instructions:  No Changes In Medications at this time.  *If you need a refill on your cardiac medications before your next appointment, please call your pharmacy*  Lab Work: None Ordered At This Time.  If you have labs (blood work) drawn today and your tests are completely normal, you will receive your results only by: Marland Kitchen. MyChart Message (if you have MyChart) OR . A paper copy in the mail If you have any lab test that is abnormal or we need to change your treatment, we will call you to review the results.  Testing/Procedures:  Your physician has requested that you have an echocardiogram. Echocardiography is a painless test that uses sound waves to create images of your heart. It provides your doctor with information about the size and shape of your heart and how well your heart's chambers  and valves are working. You may receive an ultrasound enhancing agent through an IV if needed to better visualize your heart during the echo.This procedure takes approximately one hour. There are no restrictions for this procedure. [address]   Your physician has recommended that you wear an event monitor. Event monitors are medical devices that record the heart's electrical activity. Doctors most often us these monitors to diagnose arrhythmias. Arrhythmias are problems with the speed or rhythm of the heartbeat. The monitor is a small, portable device. You can wear one while you do your normal daily activities. This is usually used to diagnose what is causing palpitations/syncope (passing out).  [employer] Cardiac Event Monitor Instructions Your physician has requested you wear your cardiac event monitor for 30 days, (1-30). [employer] may call or text to confirm a shipping address. The monitor will be sent to a land address via UPS. [employer] will not ship a monitor to a PO BOX. It typically takes 3-5 days to receive your monitor after it has been enrolled. [employer] will assist with USPS tracking if your package is delayed. The telephone number for [employer] is [phone]. Once you have received your monitor, please review the enclosed instructions. Instruction tutorials can also be viewed under help and settings on the enclosed cell phone. Your monitor has already been registered assigning a specific monitor serial # to you.  Applying the monitor Remove cell phone from case and turn it on. The cell phone works as IT consultantyour transmitter and needs to be within [employer] feet of you at all times. The cell phone will need to be charged on a daily basis. We recommend you plug the cell phone into the enclosed charger at your bedside table every night.  Monitor batteries: You will receive two monitor batteries labelled #1 and #2. These are your recorders.  Plug battery #2 onto the second connection on the enclosed charger. Keep one battery on the charger at all times. This will keep the monitor battery deactivated. It will also keep it fully charged for when you need to switch your monitor batteries. A small light will be blinking on the battery emblem when it is charging. The light on the battery emblem will remain on when the battery is fully charged.  Open package of a Monitor strip. Insert battery #1 into black hood on strip and gently squeeze monitor battery onto connection as indicated in  instruction booklet. Set aside while preparing skin.  Choose location for your strip, vertical or horizontal, as indicated in the instruction booklet. Shave to remove all hair from location. There cannot be any lotions, oils, powders, or colognes on skin where monitor is to be applied. Wipe skin clean with enclosed Saline wipe. Dry skin completely.  Peel paper labeled #1 off the back of the Monitor strip exposing the adhesive. Place the monitor on the chest in the vertical or horizontal position shown in the instruction booklet. One arrow on the monitor strip must be pointing upward. Carefully remove paper labeled #2, attaching remainder of strip to your skin. Try not to create any folds or wrinkles in the strip as you apply it.  Firmly press and release the circle in the center of the monitor battery. You will hear a small beep. This is turning the monitor battery on. The heart emblem on the monitor battery will light up every 5 seconds if the monitor battery in turned on and connected to the patient securely. Do not push and hold the circle down as this turns the monitor battery off. The cell phone will locate the monitor battery. A screen will appear on the cell phone checking the connection of your monitor strip. This may read poor connection initially but change to good connection within the next minute. Once your monitor accepts the connection you  will hear a series of 3 beeps followed by a climbing crescendo of beeps. A screen will appear on the cell phone showing the two monitor strip placement options. Touch the picture that demonstrates where you applied the monitor strip.  Your monitor strip and battery are waterproof. You are able to shower, bathe, or swim with the monitor on. They just ask you do not submerge deeper than 3 feet underwater. We recommend removing the monitor if you are swimming in a lake, river, or ocean.  Your monitor battery will need to be switched to a fully charged monitor battery approximately once a week. The cell phone will alert you of an action which needs to be made.  On the cell phone, tap for details to reveal connection status, monitor battery status, and cell phone battery status. The green dots indicates your monitor is in good status. A red dot indicates there is something that needs your attention.  To record a symptom, click the circle on the monitor battery. In 30-60 seconds a list of symptoms will appear on the cell phone. Select your symptom and tap save. Your monitor will record a sustained or significant arrhythmia regardless of you clicking the button. Some patients do not feel the heart rhythm irregularities. [employer] will notify us of any serious or critical events.  Refer to instruction booklet for instructions on switching batteries, changing strips, the Do not disturb or Pause features, or any additional questions.  Call [employer] at [phone], to confirm your monitor is transmitting and record your baseline. They will answer any questions you may have regarding the monitor instructions at that time.  Returning the monitor to [employer] Place all equipment back into blue box. Peel off strip of paper to expose adhesive and close box securely. There is a prepaid UPS shipping label on this box. Drop in a UPS drop box, or at a UPS facility like [employer]. You may also contact  [employer] to arrange UPS to pick up monitor package at your home.  Follow-Up: At [facility], you and your health needs are our priority.  As part of  our continuing mission to provide you with exceptional heart care, we have created designated Provider Care Teams.  These Care Teams include your primary Cardiologist (physician) and Advanced Practice Providers (APPs -  Physician Assistants and Nurse Practitioners) who all work together to provide you with the care you need, when you need it.  We recommend signing up for the patient portal called "MyChart".  Sign up information is provided on this After Visit Summary.  MyChart is used to connect with patients for Virtual Visits (Telemedicine).  Patients are able to view lab/test results, encounter notes, upcoming appointments, etc.  Non-urgent messages can be sent to your provider as well.   To learn more about what you can do with MyChart, go to [email or web address].    Your next appointment:   2 month(s)  The format for your next appointment:   In Person  Provider:   Weston Brass, MD

## 2020-07-01 NOTE — Progress Notes (Signed)
Patient ID: Dennis Osborn, male   DOB: 03/09/1941, 79 y.o.   MRN: [id number] Patient enrolled for [employer] to ship a 30 day cardiac event monitor to hisl home.

## 2020-07-01 NOTE — Patient Instructions (Signed)
Medication Instructions:  No Changes In Medications at this time.  *If you need a refill on your cardiac medications before your next appointment, please call your pharmacy*  Lab Work: None Ordered At This Time.  If you have labs (blood work) drawn today and your tests are completely normal, you will receive your results only by: Marland Kitchen MyChart Message (if you have MyChart) OR . A paper copy in the mail If you have any lab test that is abnormal or we need to change your treatment, we will call you to review the results.  Testing/Procedures:  Your physician has requested that you have an echocardiogram. Echocardiography is a painless test that uses sound waves to create images of your heart. It provides your doctor with information about the size and shape of your heart and how well your heart's chambers and valves are working. You may receive an ultrasound enhancing agent through an IV if needed to better visualize your heart during the echo.This procedure takes approximately one hour. There are no restrictions for this procedure. This will take place at the [address].   Your physician has recommended that you wear an event monitor. Event monitors are medical devices that record the heart's electrical activity. Doctors most often [place] these monitors to diagnose arrhythmias. Arrhythmias are problems with the speed or rhythm of the heartbeat. The monitor is a small, portable device. You can wear one while you do your normal daily activities. This is usually used to diagnose what is causing palpitations/syncope (passing out).  [employer] Cardiac Event Monitor Instructions Your physician has requested you wear your cardiac event monitor for 30 days, (1-30). [employer] may call or text to confirm a shipping address. The monitor will be sent to a land address via UPS. [employer] will not ship a monitor to a PO BOX. It typically takes 3-5 days to receive your monitor after it has been  enrolled. [employer] will assist with USPS tracking if your package is delayed. The telephone number for [employer] is [phone]. Once you have received your monitor, please review the enclosed instructions. Instruction tutorials can also be viewed under help and settings on the enclosed cell phone. Your monitor has already been registered assigning a specific monitor serial # to you.  Applying the monitor Remove cell phone from case and turn it on. The cell phone works as IT consultant and needs to be within [employer] of you at all times. The cell phone will need to be charged on a daily basis. We recommend you plug the cell phone into the enclosed charger at your bedside table every night.  Monitor batteries: You will receive two monitor batteries labelled #1 and #2. These are your recorders. Plug battery #2 onto the second connection on the enclosed charger. Keep one battery on the charger at all times. This will keep the monitor battery deactivated. It will also keep it fully charged for when you need to switch your monitor batteries. A small light will be blinking on the battery emblem when it is charging. The light on the battery emblem will remain on when the battery is fully charged.  Open package of a Monitor strip. Insert battery #1 into black hood on strip and gently squeeze monitor battery onto connection as indicated in instruction booklet. Set aside while preparing skin.  Choose location for your strip, vertical or horizontal, as indicated in the instruction booklet. Shave to remove all hair from location. There cannot be any lotions, oils, powders,  or colognes on skin where monitor is to be applied. Wipe skin clean with enclosed Saline wipe. Dry skin completely.  Peel paper labeled #1 off the back of the Monitor strip exposing the adhesive. Place the monitor on the chest in the vertical or horizontal position shown in the instruction booklet. One arrow on the monitor  strip must be pointing upward. Carefully remove paper labeled #2, attaching remainder of strip to your skin. Try not to create any folds or wrinkles in the strip as you apply it.  Firmly press and release the circle in the center of the monitor battery. You will hear a small beep. This is turning the monitor battery on. The heart emblem on the monitor battery will light up every 5 seconds if the monitor battery in turned on and connected to the patient securely. Do not push and hold the circle down as this turns the monitor battery off. The cell phone will locate the monitor battery. A screen will appear on the cell phone checking the connection of your monitor strip. This may read poor connection initially but change to good connection within the next minute. Once your monitor accepts the connection you will hear a series of 3 beeps followed by a climbing crescendo of beeps. A screen will appear on the cell phone showing the two monitor strip placement options. Touch the picture that demonstrates where you applied the monitor strip.  Your monitor strip and battery are waterproof. You are able to shower, bathe, or swim with the monitor on. They just ask you do not submerge deeper than 3 feet underwater. We recommend removing the monitor if you are swimming in a lake, river, or ocean.  Your monitor battery will need to be switched to a fully charged monitor battery approximately once a week. The cell phone will alert you of an action which needs to be made.  On the cell phone, tap for details to reveal connection status, monitor battery status, and cell phone battery status. The green dots indicates your monitor is in good status. A red dot indicates there is something that needs your attention.  To record a symptom, click the circle on the monitor battery. In 30-60 seconds a list of symptoms will appear on the cell phone. Select your symptom and tap save. Your monitor will record a sustained  or significant arrhythmia regardless of you clicking the button. Some patients do not feel the heart rhythm irregularities. [employer] will notify us of any serious or critical events.  Refer to instruction booklet for instructions on switching batteries, changing strips, the Do not disturb or Pause features, or any additional questions.  Call [employer] at [phone], to confirm your monitor is transmitting and record your baseline. They will answer any questions you may have regarding the monitor instructions at that time.  Returning the monitor to [employer] Place all equipment back into blue box. Peel off strip of paper to expose adhesive and close box securely. There is a prepaid UPS shipping label on this box. Drop in a UPS drop box, or at a UPS facility like [employer]. You may also contact [employer] to arrange UPS to pick up monitor package at your home.  Follow-Up: At [facility], you and your health needs are our priority.  As part of our continuing mission to provide you with exceptional heart care, we have created designated Provider Care Teams.  These Care Teams include your primary Cardiologist (physician) and Advanced Practice Providers (APPs -  Physician Assistants and  Nurse Practitioners) who all work together to provide you with the care you need, when you need it.  We recommend signing up for the patient portal called "MyChart".  Sign up information is provided on this After Visit Summary.  MyChart is used to connect with patients for Virtual Visits (Telemedicine).  Patients are able to view lab/test results, encounter notes, upcoming appointments, etc.  Non-urgent messages can be sent to your provider as well.   To learn more about what you can do with MyChart, go to [email or web address].    Your next appointment:   2 month(s)  The format for your next appointment:   In Person  Provider:   Weston Brass, MD

## 2020-07-09 ENCOUNTER — Ambulatory Visit (INDEPENDENT_AMBULATORY_CARE_PROVIDER_SITE_OTHER): Payer: Medicare Other

## 2020-07-09 DIAGNOSIS — R42 Dizziness and giddiness: Secondary | ICD-10-CM

## 2020-07-09 DIAGNOSIS — R001 Bradycardia, unspecified: Secondary | ICD-10-CM | POA: Diagnosis not present

## 2020-07-16 ENCOUNTER — Other Ambulatory Visit: Payer: Self-pay

## 2020-07-16 ENCOUNTER — Ambulatory Visit (HOSPITAL_COMMUNITY): Payer: Medicare Other | Attending: Cardiology

## 2020-07-16 DIAGNOSIS — R001 Bradycardia, unspecified: Secondary | ICD-10-CM

## 2020-07-16 DIAGNOSIS — R42 Dizziness and giddiness: Secondary | ICD-10-CM

## 2020-07-16 LAB — ECHOCARDIOGRAM COMPLETE
Area-P 1/2: 3.27 cm2
S' Lateral: 2.5 cm

## 2020-07-19 ENCOUNTER — Encounter: Payer: Self-pay | Admitting: Podiatry

## 2020-07-19 ENCOUNTER — Ambulatory Visit (INDEPENDENT_AMBULATORY_CARE_PROVIDER_SITE_OTHER): Payer: Medicare Other

## 2020-07-19 ENCOUNTER — Other Ambulatory Visit: Payer: Self-pay

## 2020-07-19 ENCOUNTER — Ambulatory Visit (INDEPENDENT_AMBULATORY_CARE_PROVIDER_SITE_OTHER): Payer: Medicare Other | Admitting: Podiatry

## 2020-07-19 VITALS — BP 128/75 | HR 81 | Temp 97.5°F

## 2020-07-19 DIAGNOSIS — M79671 Pain in right foot: Secondary | ICD-10-CM

## 2020-07-19 DIAGNOSIS — L923 Foreign body granuloma of the skin and subcutaneous tissue: Secondary | ICD-10-CM | POA: Diagnosis not present

## 2020-07-19 DIAGNOSIS — M779 Enthesopathy, unspecified: Secondary | ICD-10-CM

## 2020-07-23 NOTE — Progress Notes (Signed)
Subjective:   Patient ID: Dennis Osborn, male   DOB: 79 y.o.   MRN: [id number]   HPI Patient presents with trauma underneath the right big toenail and states he does not remember specific injury but states it sore and he is concerned about his health overall.  Patient does not smoke likes to be active   Review of Systems  All other systems reviewed and are negative.       Objective:  Physical Exam Vitals and nursing note reviewed.  Constitutional:      Appearance: He is well-developed.  Pulmonary:     Effort: Pulmonary effort is normal.  Musculoskeletal:        General: Normal range of motion.  Skin:    General: Skin is warm.  Neurological:     Mental Status: He is alert.     Neurovascular status found to be intact muscle strength was found to be adequate range of motion within normal limits.  Patient is found to have a damaged right big toenail with what appears to be a foreign body or contusion which may have occurred in the distal portion of the nailbed.  It is localized with no proximal edema erythema or drainage noted.  Patient has good digital perfusion well oriented     Assessment:  Appears to be inflammatory and may be lesion formation F2 H&P done and careful debridement of tissue flushed the wound and applied sterile dressing.  I gave instructions on soaks and patient will be seen back to recheck and may require more aggressive procedure or nail removal and I made the patient aware of all this at the current time     Plan:  Patient was made aware of this

## 2020-08-13 ENCOUNTER — Telehealth: Payer: Self-pay | Admitting: Internal Medicine

## 2020-08-13 NOTE — Telephone Encounter (Signed)
Patient is calling because his heart monitor he was wearing cut off and told him to contact his doctor. He is assuming it would be Dr. Jacques Navy since she is the one who ordered it. He says he's not sure what to do with the monitor at this point.

## 2020-08-13 NOTE — Telephone Encounter (Signed)
Explained procedure to return monitor to [employer] in prepaid UPS package.  Dr. Jacques Navy will have his nurse call with results or discuss with him at his 08/28/20 follow up appointment.

## 2020-08-28 ENCOUNTER — Ambulatory Visit: Payer: Medicare Other | Admitting: Internal Medicine

## 2020-08-28 ENCOUNTER — Other Ambulatory Visit: Payer: Self-pay

## 2020-08-28 VITALS — BP 152/70 | HR 45 | Temp 97.4°F | Ht 68.0 in | Wt 158.0 lb

## 2020-08-28 DIAGNOSIS — R030 Elevated blood-pressure reading, without diagnosis of hypertension: Secondary | ICD-10-CM | POA: Diagnosis not present

## 2020-08-28 DIAGNOSIS — R42 Dizziness and giddiness: Secondary | ICD-10-CM

## 2020-08-28 DIAGNOSIS — E785 Hyperlipidemia, unspecified: Secondary | ICD-10-CM | POA: Diagnosis not present

## 2020-08-28 DIAGNOSIS — R001 Bradycardia, unspecified: Secondary | ICD-10-CM

## 2020-08-28 NOTE — Progress Notes (Signed)
Cardiology Office Note:    Date:  08/28/2020   ID:  [name], DOB 09-20-1941, MRN [id number]  PCP:  Geoffry Paradise, MD  Cardiologist:  Parke Poisson, MD  Electrophysiologist:  None   Referring MD: Geoffry Paradise, MD   Chief Complaint/Reason for Referral: Follow up dizziness, falls  History of Present Illness:    Dennis Osborn is a 79 y.o. male with a history of hyperlipidemia, impaired fasting glucose, osteoarthritis who presents today for follow up evaluation of dizziness and falls.   Reviewed monitor results which show 55% burden of bradycardia, with no diary events or reports of syncope or presyncope. He overall feels well and does not feel he needs further evaluation. His echocardiogram was structurally normal.   The patient denies chest pain, chest pressure, dyspnea at rest or with exertion, palpitations, PND, orthopnea, or leg swelling. Denies cough, fever, chills. Denies nausea, vomiting. Denies syncope or presyncope. Denies dizziness or lightheadedness.   Past Medical History:  Diagnosis Date  . Hyperlipidemia     Past Surgical History:  Procedure Laterality Date  . APPENDECTOMY    . MINOR IRRIGATION AND DEBRIDEMENT OF WOUND Left 01/25/2018   Procedure: LEFT INDEX/LONG/RING FINGER IRRIGATION AND DEBRIDEMENT, REPAIR LACERATIONS, PINNING OF FRACTURES;  Surgeon: Betha Loa, MD;  Location: [facility] OR;  Service: Orthopedics;  Laterality: Left;    Current Medications: No outpatient medications have been marked as taking for the 08/28/20 encounter (Office Visit) with Parke Poisson, MD.  Unable to pull medications into note. Reviewed.    Allergies:   Patient has no known allergies.   Social History   Tobacco Use  . Smoking status: Never Smoker  . Smokeless tobacco: Never Used  Substance Use Topics  . Alcohol use: Never  . Drug use: Not on file     Family History: The patient's family history is not on file.  ROS:   Please see the history of present  illness.    All other systems reviewed and are negative.  EKGs/Labs/Other Studies Reviewed:    The following studies were reviewed today:  EKG:  Sinus bradycardia rate 45 bpm  Recent Labs: No results found for requested labs within last 8760 hours.  Recent Lipid Panel No results found for: CHOL, TRIG, HDL, CHOLHDL, VLDL, LDLCALC, LDLDIRECT  Physical Exam:    VS:  BP (!) 152/70   Pulse (!) 45   Temp (!) 97.4 F (36.3 C)   Ht 5\' 8"  (1.727 m)   Wt 158 lb (71.7 kg)   SpO2 98%   BMI 24.02 kg/m     Wt Readings from Last 5 Encounters:  08/28/20 158 lb (71.7 kg)  07/01/20 158 lb 3.2 oz (71.8 kg)  01/25/18 170 lb (77.1 kg)    Constitutional: No acute distress Eyes: sclera non-icteric, normal conjunctiva and lids ENMT: normal dentition, moist mucous membranes Cardiovascular: regular rhythm, normal rate, no murmurs. S1 and S2 normal. Radial pulses normal bilaterally. No jugular venous distention.  Respiratory: clear to auscultation bilaterally GI : normal bowel sounds, soft and nontender. No distention.   MSK: extremities warm, well perfused. No edema.  NEURO: grossly nonfocal exam, moves all extremities. PSYCH: alert and oriented x 3, normal mood and affect.   ASSESSMENT:    1. Dizziness   2. Bradycardia   3. Elevated BP without diagnosis of hypertension   4. Hyperlipidemia, unspecified hyperlipidemia type    PLAN:    Dizziness - Plan: EKG 12-Lead Bradycardia - no recurrence, no worrisome findings  on monitor. - avoid av nodal blocking agents, suspect his symptoms are related to bradycardia. Patient does not want to pursue further evaluation at this time.   Elevated BP without diagnosis of hypertension - BP elevated today as well suggesting dx of hypertension. Would have him review with his PCP and consider low dose antihypertensive therapy.   Hyperlipidemia, unspecified hyperlipidemia type - continue simvastatin 80 mg daily.   Total time of encounter: 20 minutes  total time of encounter, including 15 minutes spent in face-to-face patient care on the date of this encounter. This time includes coordination of care and counseling regarding above mentioned problem list. Remainder of non-face-to-face time involved reviewing chart documents/testing relevant to the patient encounter and documentation in the medical record. I have independently reviewed documentation from referring provider.   Weston Brass, MD [place]  [facility]    Medication Adjustments/Labs and Tests Ordered: Current medicines are reviewed at length with the patient today.  Concerns regarding medicines are outlined above.   Orders Placed This Encounter  Procedures  . EKG 12-Lead    No orders of the defined types were placed in this encounter.   Patient Instructions  Medication Instructions:  No Changes In Medications at this time.  *If you need a refill on your cardiac medications before your next appointment, please call your pharmacy*  Lab Work: None Ordered At This Time.  If you have labs (blood work) drawn today and your tests are completely normal, you will receive your results only by: Marland Kitchen MyChart Message (if you have MyChart) OR . A paper copy in the mail If you have any lab test that is abnormal or we need to change your treatment, we will call you to review the results.  Testing/Procedures: None Ordered At This Time.   Follow-Up: At [facility], you and your health needs are our priority.  As part of our continuing mission to provide you with exceptional heart care, we have created designated Provider Care Teams.  These Care Teams include your primary Cardiologist (physician) and Advanced Practice Providers (APPs -  Physician Assistants and Nurse Practitioners) who all work together to provide you with the care you need, when you need it.  We recommend signing up for the patient portal called "MyChart".  Sign up information is provided on this After Visit  Summary.  MyChart is used to connect with patients for Virtual Visits (Telemedicine).  Patients are able to view lab/test results, encounter notes, upcoming appointments, etc.  Non-urgent messages can be sent to your provider as well.   To learn more about what you can do with MyChart, go to [email or web address].    Your next appointment:   6 month(s)  The format for your next appointment:   In Person  Provider:   Weston Brass, MD  Other Instructions IF YOU HAVE ANY FAINTING OR DIZZY EPISODES PLEASE CALL THE OFFICE AND LET us KNOW SO WE CAN MAKE AN APPOINTMENT FOR YOU TO BE SEEN.

## 2020-08-28 NOTE — Patient Instructions (Signed)
Medication Instructions:  No Changes In Medications at this time.  *If you need a refill on your cardiac medications before your next appointment, please call your pharmacy*  Lab Work: None Ordered At This Time.  If you have labs (blood work) drawn today and your tests are completely normal, you will receive your results only by: Marland Kitchen MyChart Message (if you have MyChart) OR . A paper copy in the mail If you have any lab test that is abnormal or we need to change your treatment, we will call you to review the results.  Testing/Procedures: None Ordered At This Time.   Follow-Up: At [facility], you and your health needs are our priority.  As part of our continuing mission to provide you with exceptional heart care, we have created designated Provider Care Teams.  These Care Teams include your primary Cardiologist (physician) and Advanced Practice Providers (APPs -  Physician Assistants and Nurse Practitioners) who all work together to provide you with the care you need, when you need it.  We recommend signing up for the patient portal called "MyChart".  Sign up information is provided on this After Visit Summary.  MyChart is used to connect with patients for Virtual Visits (Telemedicine).  Patients are able to view lab/test results, encounter notes, upcoming appointments, etc.  Non-urgent messages can be sent to your provider as well.   To learn more about what you can do with MyChart, go to [email or web address].    Your next appointment:   6 month(s)  The format for your next appointment:   In Person  Provider:   Weston Brass, MD  Other Instructions IF YOU HAVE ANY FAINTING OR DIZZY EPISODES PLEASE CALL THE OFFICE AND LET us KNOW SO WE CAN MAKE AN APPOINTMENT FOR YOU TO BE SEEN.

## 2020-10-14 ENCOUNTER — Encounter: Payer: Self-pay | Admitting: Internal Medicine

## 2021-06-04 ENCOUNTER — Other Ambulatory Visit (HOSPITAL_COMMUNITY): Payer: Self-pay | Admitting: Internal Medicine

## 2021-06-04 ENCOUNTER — Other Ambulatory Visit: Payer: Self-pay | Admitting: Internal Medicine

## 2021-06-04 ENCOUNTER — Other Ambulatory Visit: Payer: Self-pay | Admitting: General Practice

## 2021-06-04 DIAGNOSIS — R1011 Right upper quadrant pain: Secondary | ICD-10-CM

## 2021-06-06 ENCOUNTER — Other Ambulatory Visit: Payer: Self-pay

## 2021-06-06 ENCOUNTER — Ambulatory Visit (HOSPITAL_COMMUNITY)
Admission: RE | Admit: 2021-06-06 | Discharge: 2021-06-06 | Disposition: A | Discharge status: Home / Self Care | Payer: Medicare Other | Source: Ambulatory Visit | Attending: Internal Medicine | Admitting: Internal Medicine

## 2021-06-06 DIAGNOSIS — R1011 Right upper quadrant pain: Secondary | ICD-10-CM | POA: Diagnosis not present

## 2023-03-05 IMAGING — US US ABDOMEN LIMITED
1 series · 15 of 25 positions shown · non-contrast
Comparison: None.

CLINICAL DATA: Right upper quadrant abdominal pain.

EXAM:
ULTRASOUND ABDOMEN LIMITED RIGHT UPPER QUADRANT

[Series 1: us abdomen limited ruq mc & wl · 15 of 44 slices shown]
[im 1/44]
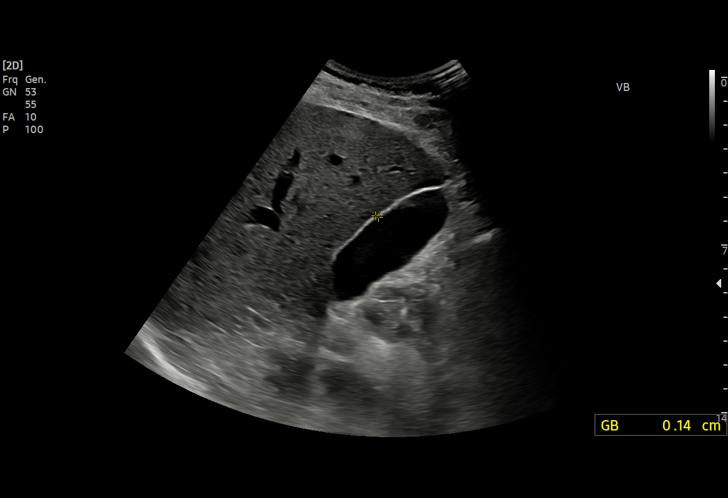
[im 4/44]
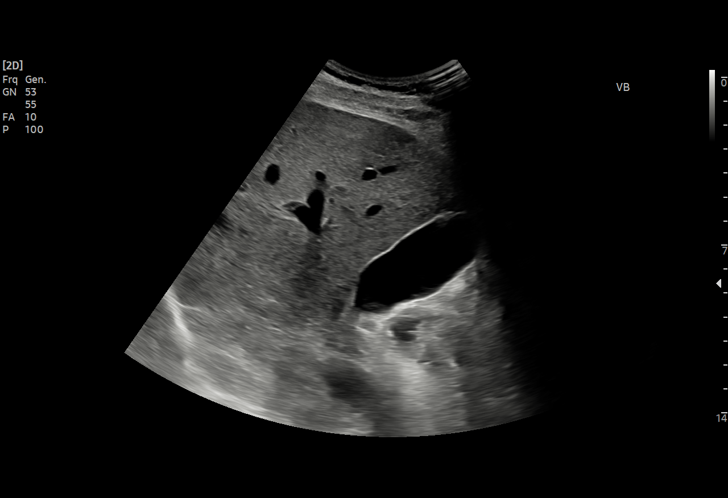
[im 8/44]
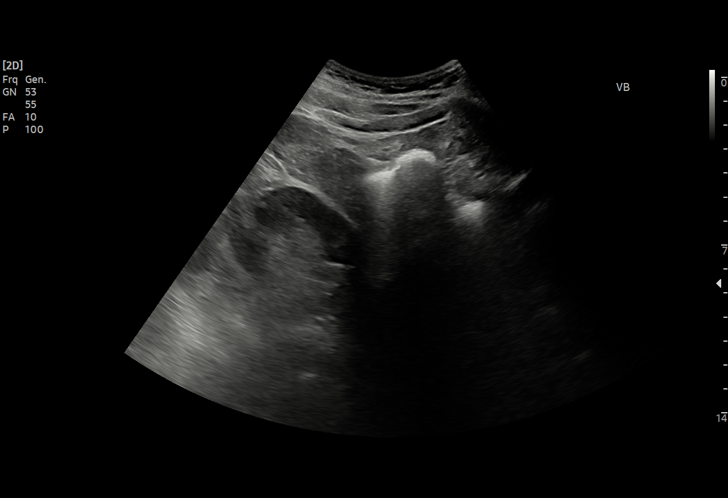
[im 9/44]
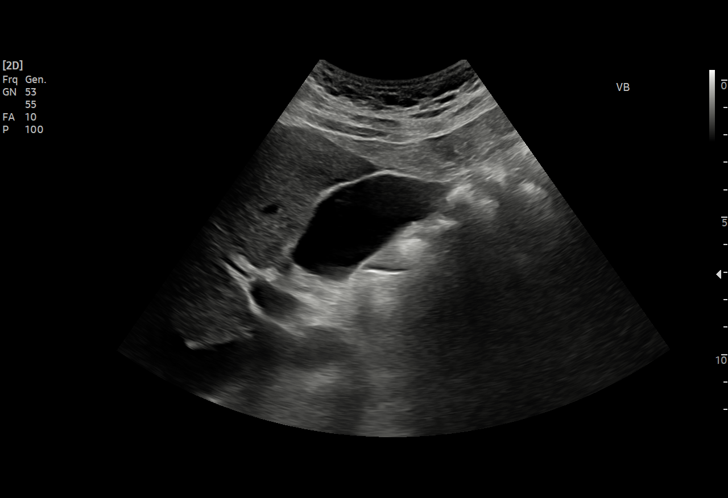
[im 13/44]
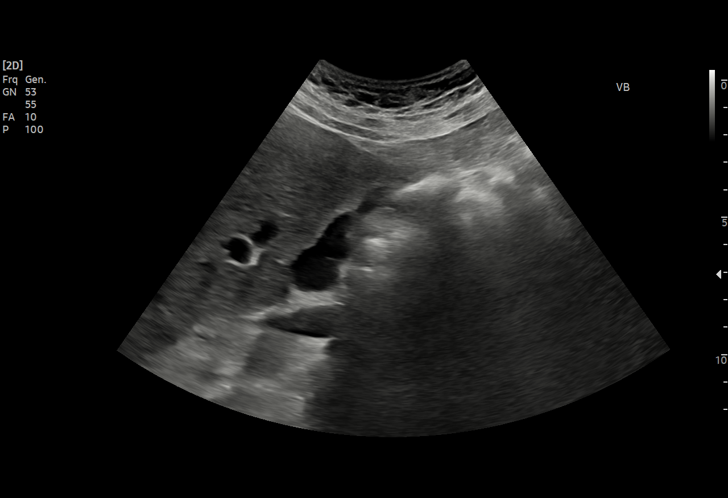
[im 17/44]
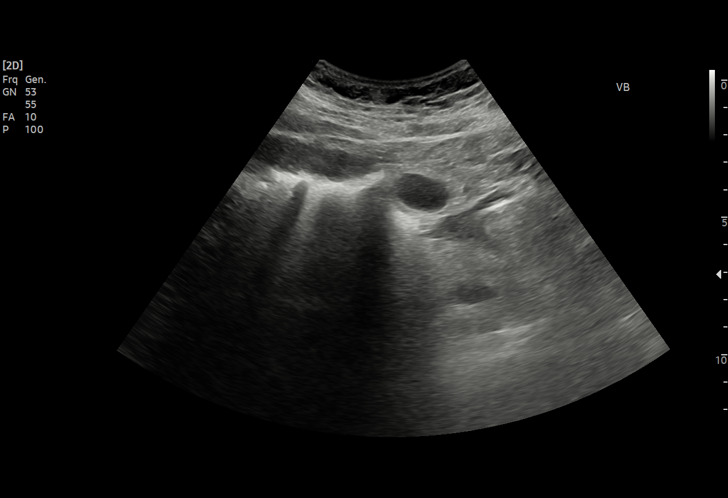
[im 18/44]
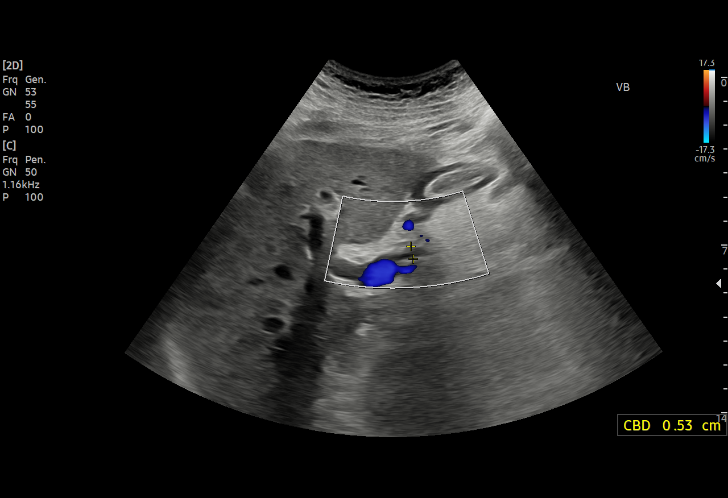
[im 22/44]
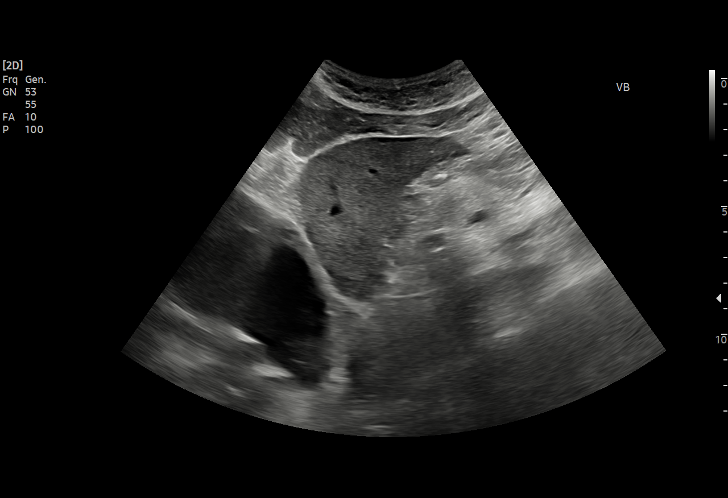
[im 26/44]
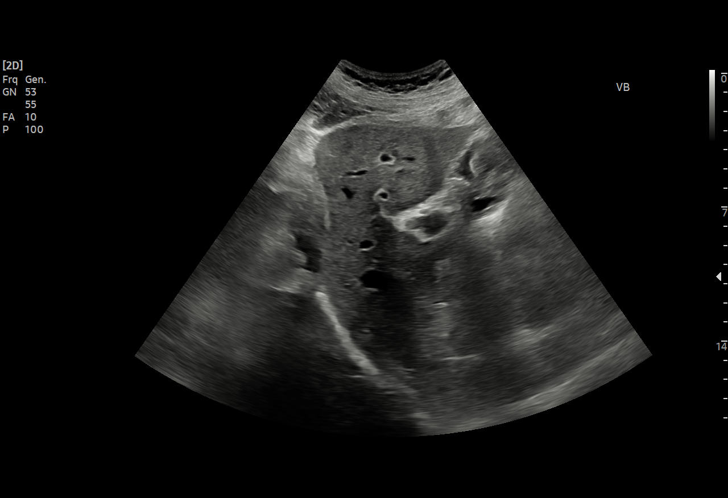
[im 27/44]
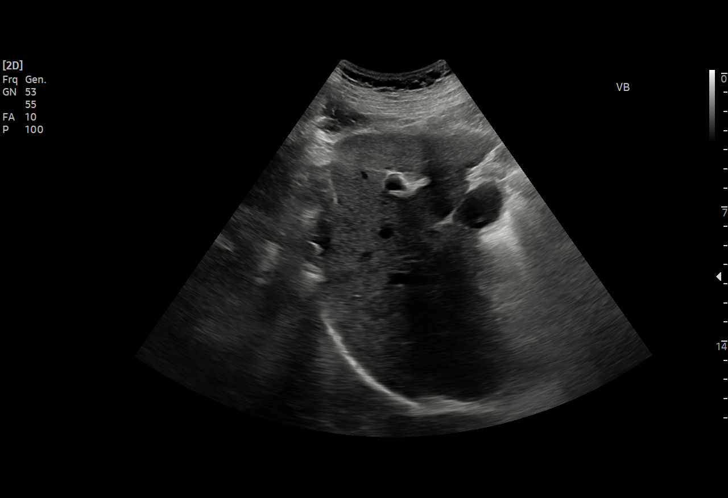
[im 31/44]
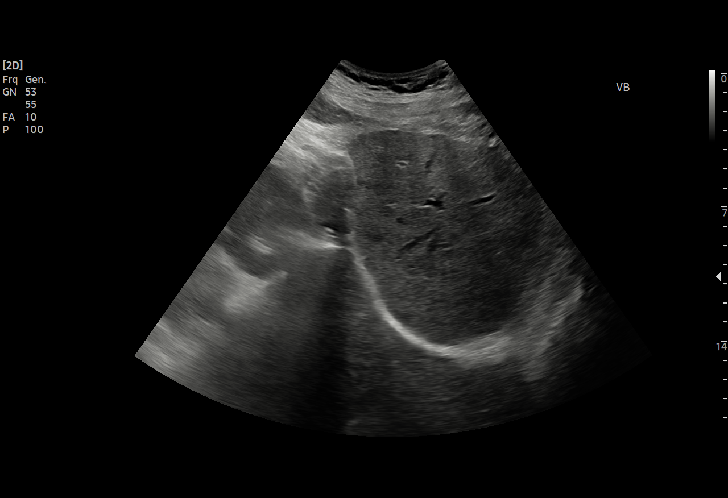
[im 35/44]
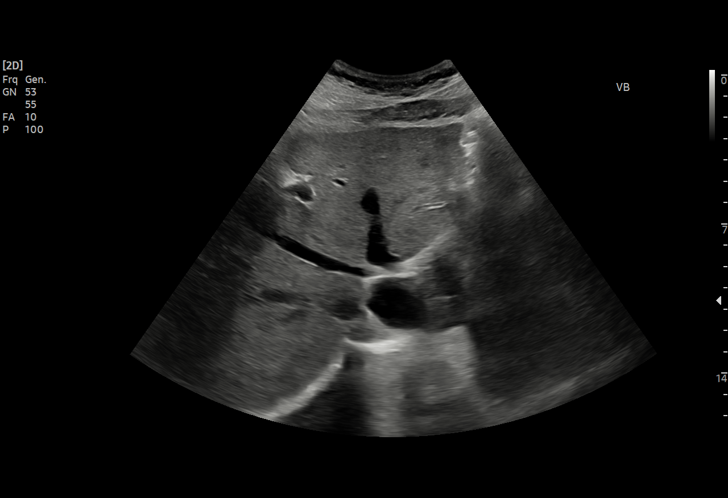
[im 36/44]
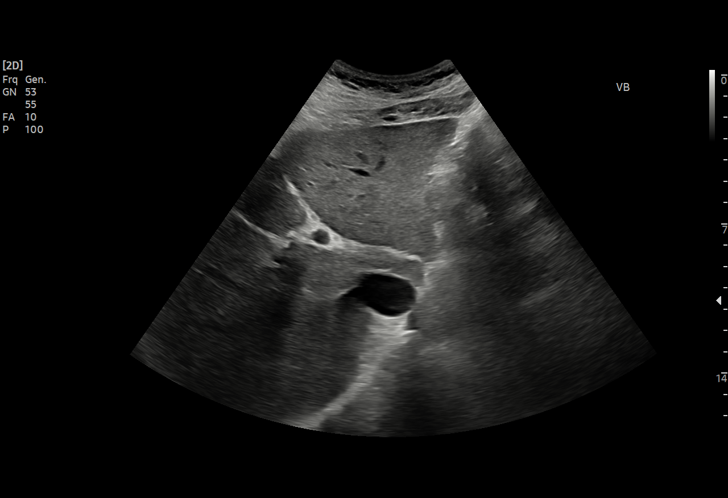
[im 40/44]
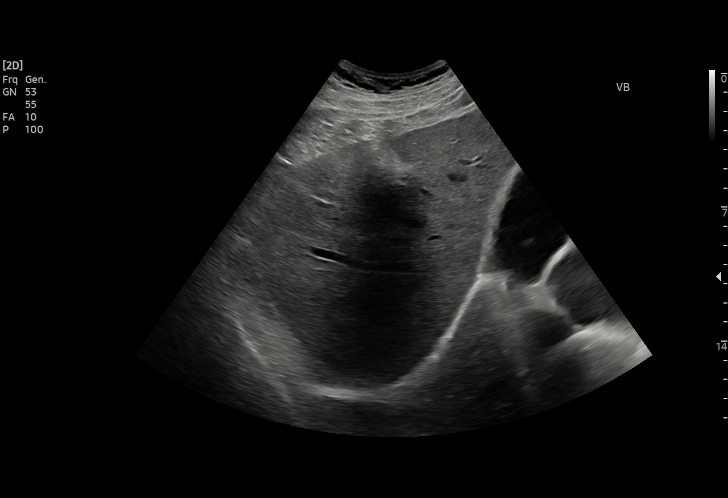
[im 44/44]
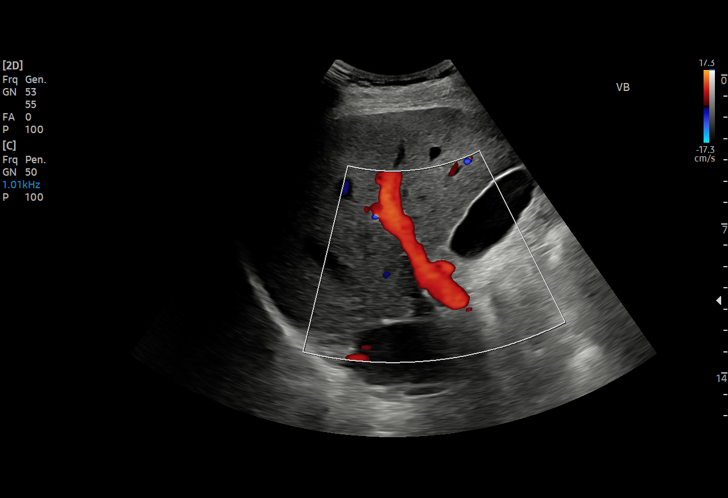

[15 of 25 positions shown; findings below may reference images not displayed]

FINDINGS: Gallbladder:

No gallstones or wall thickening visualized. No sonographic Murphy
sign noted by sonographer.

Common bile duct:

Diameter: 5 mm which is within normal limits.

Liver:

No focal lesion identified. Within normal limits in parenchymal
echogenicity. Portal vein is patent on color Doppler imaging with
normal direction of blood flow towards the liver.

Other: None.
IMPRESSION: No abnormality seen in the right upper quadrant of the abdomen.

## 2023-10-15 ENCOUNTER — Other Ambulatory Visit: Payer: Self-pay

## 2023-10-15 ENCOUNTER — Emergency Department (HOSPITAL_BASED_OUTPATIENT_CLINIC_OR_DEPARTMENT_OTHER)
Admission: EM | Admit: 2023-10-15 | Discharge: 2023-10-15 | Disposition: A | Discharge status: Home / Self Care | Payer: Medicare Other | Attending: Emergency Medicine | Admitting: Emergency Medicine

## 2023-10-15 ENCOUNTER — Encounter (HOSPITAL_BASED_OUTPATIENT_CLINIC_OR_DEPARTMENT_OTHER): Payer: Self-pay | Admitting: Emergency Medicine

## 2023-10-15 ENCOUNTER — Other Ambulatory Visit (HOSPITAL_BASED_OUTPATIENT_CLINIC_OR_DEPARTMENT_OTHER): Payer: Self-pay

## 2023-10-15 DIAGNOSIS — R1030 Lower abdominal pain, unspecified: Secondary | ICD-10-CM | POA: Insufficient documentation

## 2023-10-15 DIAGNOSIS — R339 Retention of urine, unspecified: Secondary | ICD-10-CM | POA: Insufficient documentation

## 2023-10-15 LAB — URINALYSIS, ROUTINE W REFLEX MICROSCOPIC
Bilirubin Urine: NEGATIVE
Glucose, UA: NEGATIVE mg/dL
Hgb urine dipstick: NEGATIVE
Ketones, ur: NEGATIVE mg/dL
Leukocytes,Ua: NEGATIVE
Nitrite: NEGATIVE
Protein, ur: NEGATIVE mg/dL
Specific Gravity, Urine: 1.01 (ref 1.005–1.030)
pH: 6 (ref 5.0–8.0)

## 2023-10-15 NOTE — ED Provider Notes (Signed)
[place] EMERGENCY DEPARTMENT AT [facility] Provider Note   CSN: [id number] Arrival date & time: 10/15/23  1048     History  Chief Complaint  Patient presents with   Urinary Retention    Dennis Osborn is a 82 y.o. male.  82 yo M with a cc of difficulty with urination.  Going on since last night.  Never had problems with this before.  Has been able to get little trickles of urine out.        Home Medications Prior to Admission medications   Medication Sig Start Date End Date Taking? Authorizing Provider  escitalopram (LEXAPRO) 5 MG tablet Take 5 mg by mouth daily. 09/21/23  Yes [provider]  rosuvastatin (CRESTOR) 10 MG tablet Take 10 mg by mouth at bedtime. 09/02/23  Yes [provider]  Cholecalciferol (VITAMIN D3 PO) Take 1 tablet by mouth daily.    [provider]  donepezil (ARICEPT) 10 MG tablet Take 10 mg by mouth daily. 11/13/17   [provider]  Multiple Vitamin (MULTIVITAMIN WITH MINERALS) TABS tablet Take 1 tablet by mouth daily.    [provider]  simvastatin (ZOCOR) 80 MG tablet Take 80 mg by mouth daily. 11/13/17   [provider]  sulfamethoxazole-trimethoprim (BACTRIM DS) 800-160 MG tablet Take 1 tablet by mouth 2 (two) times daily. 01/25/18   Betha Loa, MD      Allergies    Patient has no known allergies.    Review of Systems   Review of Systems  Physical Exam Updated Vital Signs BP (!) 142/69   Pulse 70   Temp 98.8 F (37.1 C)   Resp 16   Ht 5\' 8"  (1.727 m)   Wt 68.5 kg   SpO2 95%   BMI 22.96 kg/m  Physical Exam Vitals and nursing note reviewed.  Constitutional:      Appearance: He is well-developed.  HENT:     Head: Normocephalic and atraumatic.  Eyes:     Pupils: Pupils are equal, round, and reactive to light.  Neck:     Vascular: No JVD.  Cardiovascular:     Rate and Rhythm: Normal rate and regular rhythm.     Heart sounds: No murmur heard.    No friction rub. No  gallop.  Pulmonary:     Effort: No respiratory distress.     Breath sounds: No wheezing.  Abdominal:     General: There is no distension.     Tenderness: There is no abdominal tenderness. There is no guarding or rebound.     Comments: Palpable bladder mild pain in suprapubic region.  Musculoskeletal:        General: Normal range of motion.     Cervical back: Normal range of motion and neck supple.  Skin:    Coloration: Skin is not pale.     Findings: No rash.  Neurological:     Mental Status: He is alert and oriented to person, place, and time.  Psychiatric:        Behavior: Behavior normal.     ED Results / Procedures / Treatments   Labs (all labs ordered are listed, but only abnormal results are displayed) Labs Reviewed  URINALYSIS, ROUTINE W REFLEX MICROSCOPIC    EKG None  Radiology No results found.  Procedures Procedures    Medications Ordered in ED Medications - No data to display  ED Course/ Medical Decision Making/ A&P  Medical Decision Making Amount and/or Complexity of Data Reviewed Labs: ordered.   82 yo M with a chief complaint of difficulty emptying his bladder.  This has been going on since yesterday.  Postvoid residual of 507.  Will place an indwelling Foley catheter.  UA.   UA is negative for infection.  Patient feeling much better after Foley catheter placement.  Will discharge home.  Urology follow-up.  2:59 PM:  I have discussed the diagnosis/risks/treatment options with the patient.  Evaluation and diagnostic testing in the emergency department does not suggest an emergent condition requiring admission or immediate intervention beyond what has been performed at this time.  They will follow up with PCP. We also discussed returning to the ED immediately if new or worsening sx occur. We discussed the sx which are most concerning (e.g., sudden worsening pain, fever, inability to tolerate by mouth) that necessitate  immediate return. Medications administered to the patient during their visit and any new prescriptions provided to the patient are listed below.  Medications given during this visit Medications - No data to display   The patient appears reasonably screen and/or stabilized for discharge and I doubt any other medical condition or other [facility] requiring further screening, evaluation, or treatment in the ED at this time prior to discharge.          Final Clinical Impression(s) / ED Diagnoses Final diagnoses:  Urinary retention    Rx / DC Orders ED Discharge Orders     None         Melene Plan, DO 10/15/23 1500

## 2023-10-15 NOTE — ED Triage Notes (Signed)
States he is unable to urinate since yesterday. Appears to have wet spot on pants from recent urination.  Denies any hx of prostate problems or other urinary issues.

## 2023-10-15 NOTE — ED Notes (Signed)
Switched foley bag to leg bag for pt comfort. Pt education provided on care of foley, drainage, etc. Pt verbalized understanding and had no further questions at time of discharge.

## 2023-10-15 NOTE — Discharge Instructions (Signed)
He will keep the catheter in.  He will follow-up with the urologist in the office.  I would call today to try and set up an appointment usually they will see you in about a week and see if you can urinate on your own.

## 2023-10-15 NOTE — ED Notes (Signed)
Provider notified of bladder scan results of in bladder. Provider verbally ordered to insert a foley catheter. Orders placed.

## 2024-01-18 ENCOUNTER — Encounter: Payer: Self-pay | Admitting: Family Medicine

## 2024-01-18 ENCOUNTER — Ambulatory Visit (INDEPENDENT_AMBULATORY_CARE_PROVIDER_SITE_OTHER): Payer: Medicare Other | Admitting: Family Medicine

## 2024-01-18 VITALS — BP 110/80 | HR 57 | Temp 97.6°F | Ht 68.0 in | Wt 150.0 lb

## 2024-01-18 DIAGNOSIS — E785 Hyperlipidemia, unspecified: Secondary | ICD-10-CM

## 2024-01-18 DIAGNOSIS — Z7689 Persons encountering health services in other specified circumstances: Secondary | ICD-10-CM

## 2024-01-18 DIAGNOSIS — N400 Enlarged prostate without lower urinary tract symptoms: Secondary | ICD-10-CM | POA: Diagnosis not present

## 2024-01-18 NOTE — Progress Notes (Signed)
 New Patient Office Visit  Subjective    Patient ID: Dennis Osborn, male    DOB: 02-Sep-1941  Age: 83 y.o. MRN: [id number]  CC:  Chief Complaint  Patient presents with   Establish Care    Going to have surgery on 9th for urinary problems.     HPI Dennis Osborn presents to establish care Previous PCP at [facility] Dr. Jacky Kindle   Upcoming prostate surgery on 01/26/2024   On statin therapy   Unclear if he is takign Lexapro or donepezil.    Wife passed away in 01-24-2022.  Lives alone. Has a dog. Cocker spaniel.     Outpatient Encounter Medications as of 01/18/2024  Medication Sig   Cholecalciferol (VITAMIN D3 PO) Take 1 tablet by mouth daily.   donepezil (ARICEPT) 10 MG tablet Take 10 mg by mouth daily.   escitalopram (LEXAPRO) 5 MG tablet Take 5 mg by mouth daily.   Multiple Vitamin (MULTIVITAMIN WITH MINERALS) TABS tablet Take 1 tablet by mouth daily.   rosuvastatin (CRESTOR) 10 MG tablet Take 10 mg by mouth at bedtime.   [DISCONTINUED] simvastatin (ZOCOR) 80 MG tablet Take 80 mg by mouth daily.   [DISCONTINUED] sulfamethoxazole-trimethoprim (BACTRIM DS) 800-160 MG tablet Take 1 tablet by mouth 2 (two) times daily. (Patient not taking: Reported on 01/18/2024)   No facility-administered encounter medications on file as of 01/18/2024.    Past Medical History:  Diagnosis Date   Hyperlipidemia     Past Surgical History:  Procedure Laterality Date   APPENDECTOMY     MINOR IRRIGATION AND DEBRIDEMENT OF WOUND Left 01/25/2018   Procedure: LEFT INDEX/LONG/RING FINGER IRRIGATION AND DEBRIDEMENT, REPAIR LACERATIONS, PINNING OF FRACTURES;  Surgeon: Betha Loa, MD;  Location: [facility] OR;  Service: Orthopedics;  Laterality: Left;    History reviewed. No pertinent family history.  Social History   Socioeconomic History   Marital status: Married    Spouse name: Not on file   Number of children: Not on file   Years of education: Not on file   Highest education level: Not on file   Occupational History   Not on file  Tobacco Use   Smoking status: Never   Smokeless tobacco: Never  Substance and Sexual Activity   Alcohol use: Never   Drug use: Not on file   Sexual activity: Not on file  Other Topics Concern   Not on file  Social History Narrative   Not on file   Social Drivers of Health   Financial Resource Strain: Not on file  Food Insecurity: Not on file  Transportation Needs: Not on file  Physical Activity: Not on file  Stress: Not on file  Social Connections: Unknown (02/27/2022)   Received from [facility]   Social Network    Social Network: Not on file  Intimate Partner Violence: Unknown (01/19/2022)   Received from [facility]   HITS    Physically Hurt: Not on file    Insult or Talk Down To: Not on file    Threaten Physical Harm: Not on file    Scream or Curse: Not on file    Review of Systems  Constitutional:  Negative for chills, fever and malaise/fatigue.  Respiratory:  Negative for shortness of breath.   Cardiovascular:  Negative for chest pain, palpitations and leg swelling.  Gastrointestinal:  Negative for abdominal pain, constipation, diarrhea, nausea and vomiting.  Genitourinary:  Positive for frequency. Negative for dysuria and urgency.  Musculoskeletal:  Negative for falls.  Neurological:  Negative for dizziness, focal weakness and headaches.  Psychiatric/Behavioral:  Negative for depression. The patient is not nervous/anxious.         Objective    BP 110/80 (BP Location: Left Arm, Patient Position: Sitting)   Pulse (!) 57   Temp 97.6 F (36.4 C) (Temporal)   Ht 5\' 8"  (1.727 m)   Wt 150 lb (68 kg)   SpO2 98%   BMI 22.81 kg/m   Physical Exam Constitutional:      General: He is not in acute distress.    Appearance: He is not ill-appearing.  Eyes:     Extraocular Movements: Extraocular movements intact.     Conjunctiva/sclera: Conjunctivae normal.  Cardiovascular:     Rate and Rhythm: Normal rate.  Pulmonary:      Effort: Pulmonary effort is normal.  Musculoskeletal:     Cervical back: Normal range of motion and neck supple.  Skin:    General: Skin is warm and dry.  Neurological:     General: No focal deficit present.     Mental Status: He is alert and oriented to person, place, and time.  Psychiatric:        Mood and Affect: Mood normal.        Behavior: Behavior normal.        Thought Content: Thought content normal.         Assessment & Plan:   Problem List Items Addressed This Visit   None Visit Diagnoses       Hyperlipidemia, unspecified hyperlipidemia type    -  Primary     BPH without obstruction/lower urinary tract symptoms         Encounter to establish care          Here to establish care.  Request records from [facility] Reports being in his usual state of health.  Upcoming prostate surgery.  Continue statin therapy.  Follow up for fasting CPE    Return in about 10 weeks (around 03/28/2024) for fasting CPE at their convenience. schedule AWV with nurse .   [name], NP-C

## 2024-01-18 NOTE — Patient Instructions (Signed)
 I will see you back for your fasting physical in June.

## 2024-01-26 ENCOUNTER — Other Ambulatory Visit: Payer: Self-pay | Admitting: Ophthalmology

## 2024-01-27 LAB — SURGICAL PATHOLOGY

## 2024-02-29 ENCOUNTER — Other Ambulatory Visit: Payer: Self-pay

## 2024-02-29 ENCOUNTER — Encounter (HOSPITAL_COMMUNITY): Payer: Self-pay | Admitting: Internal Medicine

## 2024-02-29 ENCOUNTER — Emergency Department (HOSPITAL_COMMUNITY)

## 2024-02-29 ENCOUNTER — Inpatient Hospital Stay (HOSPITAL_COMMUNITY)
Admission: EM | Admit: 2024-02-29 | Discharge: 2024-03-10 | DRG: 522 | Disposition: A | Discharge status: Skilled Nursing Facility | Attending: Internal Medicine | Admitting: Internal Medicine

## 2024-02-29 DIAGNOSIS — Y93H9 Activity, other involving exterior property and land maintenance, building and construction: Secondary | ICD-10-CM

## 2024-02-29 DIAGNOSIS — E872 Acidosis, unspecified: Secondary | ICD-10-CM

## 2024-02-29 DIAGNOSIS — W010XXA Fall on same level from slipping, tripping and stumbling without subsequent striking against object, initial encounter: Secondary | ICD-10-CM | POA: Diagnosis present

## 2024-02-29 DIAGNOSIS — S72001A Fracture of unspecified part of neck of right femur, initial encounter for closed fracture: Principal | ICD-10-CM

## 2024-02-29 DIAGNOSIS — Y792 Prosthetic and other implants, materials and accessory orthopedic devices associated with adverse incidents: Secondary | ICD-10-CM | POA: Diagnosis not present

## 2024-02-29 DIAGNOSIS — S72011P Unspecified intracapsular fracture of right femur, subsequent encounter for closed fracture with malunion: Secondary | ICD-10-CM | POA: Diagnosis not present

## 2024-02-29 DIAGNOSIS — S72011A Unspecified intracapsular fracture of right femur, initial encounter for closed fracture: Secondary | ICD-10-CM | POA: Diagnosis not present

## 2024-02-29 DIAGNOSIS — S72019A Unspecified intracapsular fracture of unspecified femur, initial encounter for closed fracture: Secondary | ICD-10-CM | POA: Diagnosis present

## 2024-02-29 DIAGNOSIS — Y92230 Patient room in hospital as the place of occurrence of the external cause: Secondary | ICD-10-CM | POA: Diagnosis not present

## 2024-02-29 DIAGNOSIS — N400 Enlarged prostate without lower urinary tract symptoms: Secondary | ICD-10-CM | POA: Diagnosis present

## 2024-02-29 DIAGNOSIS — T84020A Dislocation of internal right hip prosthesis, initial encounter: Secondary | ICD-10-CM | POA: Diagnosis not present

## 2024-02-29 DIAGNOSIS — E785 Hyperlipidemia, unspecified: Secondary | ICD-10-CM | POA: Diagnosis present

## 2024-02-29 DIAGNOSIS — W06XXXA Fall from bed, initial encounter: Secondary | ICD-10-CM | POA: Diagnosis not present

## 2024-02-29 DIAGNOSIS — Y92007 Garden or yard of unspecified non-institutional (private) residence as the place of occurrence of the external cause: Secondary | ICD-10-CM

## 2024-02-29 DIAGNOSIS — E8721 Acute metabolic acidosis: Secondary | ICD-10-CM | POA: Diagnosis present

## 2024-02-29 DIAGNOSIS — D72829 Elevated white blood cell count, unspecified: Secondary | ICD-10-CM | POA: Diagnosis present

## 2024-02-29 DIAGNOSIS — S73004D Unspecified dislocation of right hip, subsequent encounter: Secondary | ICD-10-CM | POA: Diagnosis not present

## 2024-02-29 DIAGNOSIS — S73004A Unspecified dislocation of right hip, initial encounter: Secondary | ICD-10-CM | POA: Diagnosis not present

## 2024-02-29 LAB — CBC WITH DIFFERENTIAL/PLATELET
Abs Immature Granulocytes: 0.08 10*3/uL — ABNORMAL HIGH (ref 0.00–0.07)
Basophils Absolute: 0.1 10*3/uL (ref 0.0–0.1)
Basophils Relative: 0 %
Eosinophils Absolute: 0 10*3/uL (ref 0.0–0.5)
Eosinophils Relative: 0 %
HCT: 43.4 % (ref 39.0–52.0)
Hemoglobin: 14.7 g/dL (ref 13.0–17.0)
Immature Granulocytes: 1 %
Lymphocytes Relative: 9 %
Lymphs Abs: 1.4 10*3/uL (ref 0.7–4.0)
MCH: 31.7 pg (ref 26.0–34.0)
MCHC: 33.9 g/dL (ref 30.0–36.0)
MCV: 93.5 fL (ref 80.0–100.0)
Monocytes Absolute: 0.7 10*3/uL (ref 0.1–1.0)
Monocytes Relative: 4 %
Neutro Abs: 13.4 10*3/uL — ABNORMAL HIGH (ref 1.7–7.7)
Neutrophils Relative %: 86 %
Platelets: 246 10*3/uL (ref 150–400)
RBC: 4.64 MIL/uL (ref 4.22–5.81)
RDW: 12.8 % (ref 11.5–15.5)
WBC: 15.6 10*3/uL — ABNORMAL HIGH (ref 4.0–10.5)
nRBC: 0 % (ref 0.0–0.2)

## 2024-02-29 LAB — SURGICAL PCR SCREEN
MRSA, PCR: NEGATIVE
Staphylococcus aureus: NEGATIVE

## 2024-02-29 LAB — BASIC METABOLIC PANEL WITH GFR
Anion gap: 13 (ref 5–15)
BUN: 18 mg/dL (ref 8–23)
CO2: 21 mmol/L — ABNORMAL LOW (ref 22–32)
Calcium: 9.2 mg/dL (ref 8.9–10.3)
Chloride: 105 mmol/L (ref 98–111)
Creatinine, Ser: 1.12 mg/dL (ref 0.61–1.24)
GFR, Estimated: >60 mL/min (ref >60)
Glucose, Bld: 111 mg/dL — ABNORMAL HIGH (ref 70–99)
Potassium: 4 mmol/L (ref 3.5–5.1)
Sodium: 139 mmol/L (ref 135–145)

## 2024-02-29 LAB — PROTIME-INR
INR: 1 (ref 0.8–1.2)
Prothrombin Time: 13.8 s (ref 11.4–15.2)

## 2024-02-29 LAB — ABO/RH: ABO/RH(D): A POS

## 2024-02-29 LAB — TYPE AND SCREEN
ABO/RH(D): A POS
Antibody Screen: NEGATIVE

## 2024-02-29 MED ORDER — HYDROMORPHONE HCL 1 MG/ML IJ SOLN
0.5000 mg | INTRAMUSCULAR | Status: DC | PRN
  Administered 2024-02-29 – 2024-03-01 (×2): 0.5 mg via INTRAVENOUS
  Filled 2024-02-29: qty 1
  Filled 2024-02-29: qty 0.5

## 2024-02-29 MED ORDER — ACETAMINOPHEN 500 MG PO TABS
1000.0000 mg | ORAL_TABLET | Freq: Once | ORAL | Status: DC
  Filled 2024-02-29: qty 2

## 2024-02-29 MED ORDER — HYDROCODONE-ACETAMINOPHEN 5-325 MG PO TABS
1.0000 | ORAL_TABLET | Freq: Four times a day (QID) | ORAL | Status: DC | PRN
  Administered 2024-02-29 – 2024-03-01 (×2): 1 via ORAL
  Filled 2024-02-29 (×2): qty 1

## 2024-02-29 MED ORDER — HYDROMORPHONE HCL 1 MG/ML IJ SOLN
0.5000 mg | INTRAMUSCULAR | Status: DC | PRN
Start: 2024-02-29 — End: 2024-02-29

## 2024-02-29 MED ORDER — HYDROMORPHONE HCL 1 MG/ML IJ SOLN
0.5000 mg | INTRAMUSCULAR | Status: DC | PRN
  Administered 2024-02-29: 0.5 mg via INTRAVENOUS
  Filled 2024-02-29: qty 1

## 2024-02-29 MED ORDER — ONDANSETRON HCL 4 MG/2ML IJ SOLN
4.0000 mg | Freq: Once | INTRAMUSCULAR | Status: AC
  Administered 2024-02-29: 4 mg via INTRAVENOUS
  Filled 2024-02-29: qty 2

## 2024-02-29 MED ORDER — POLYETHYLENE GLYCOL 3350 17 G PO PACK: 17.0000 g | PACK | Freq: Every day | ORAL | Status: DC | PRN

## 2024-02-29 MED ORDER — ROSUVASTATIN CALCIUM 10 MG PO TABS
10.0000 mg | ORAL_TABLET | Freq: Every day | ORAL | Status: DC
  Administered 2024-02-29 – 2024-03-10 (×11): 10 mg via ORAL
  Filled 2024-02-29 (×11): qty 1

## 2024-02-29 MED ORDER — ORAL CARE MOUTH RINSE: 15.0000 mL | OROMUCOSAL | Status: DC | PRN

## 2024-02-29 NOTE — ED Provider Notes (Signed)
 [place] EMERGENCY DEPARTMENT AT [facility] Provider Note   CSN: [id number] Arrival date & time: 02/29/24  1025     History  Chief Complaint  Patient presents with   Dennis Osborn is a 83 y.o. male who to the emergency department for evaluation of right hip injury.  Patient was fixing a gutter left a tool on the ground, stepped back over the tool on the ground and fell directly onto his right hip.  He had immediate severe pain but was able to get himself up using pitchfork lose consciousness does not use any blood thinners.  The history is provided by the patient and a relative.  Fall       Home Medications Prior to Admission medications   Medication Sig Start Date End Date Taking? Authorizing Provider  Cholecalciferol (VITAMIN D3 PO) Take 1 tablet by mouth daily.    [provider]  donepezil (ARICEPT) 10 MG tablet Take 10 mg by mouth daily. 11/13/17   [provider]  escitalopram (LEXAPRO) 5 MG tablet Take 5 mg by mouth daily. 09/21/23   [provider]  Multiple Vitamin (MULTIVITAMIN WITH MINERALS) TABS tablet Take 1 tablet by mouth daily.    [provider]  rosuvastatin (CRESTOR) 10 MG tablet Take 10 mg by mouth at bedtime. 09/02/23   [provider]      Allergies    Patient has no known allergies.    Review of Systems   Review of Systems  Physical Exam Updated Vital Signs BP (!) 178/76 (BP Location: Left Arm)   Pulse (!) 54   Temp 98.4 F (36.9 C) (Oral)   Resp 18   Ht 5\' 8"  (1.727 m)   Wt 72.6 kg   SpO2 100%   BMI 24.33 kg/m  Physical Exam Vitals and nursing note reviewed.  Constitutional:      General: He is not in acute distress.    Appearance: He is well-developed. He is not diaphoretic.  HENT:     Head: Normocephalic and atraumatic.  Eyes:     General: No scleral icterus.    Conjunctiva/sclera: Conjunctivae normal.  Cardiovascular:     Rate and Rhythm: Normal rate and regular  rhythm.     Heart sounds: Normal heart sounds.  Pulmonary:     Effort: Pulmonary effort is normal. No respiratory distress.     Breath sounds: Normal breath sounds.  Abdominal:     Palpations: Abdomen is soft.     Tenderness: There is no abdominal tenderness.  Musculoskeletal:     Cervical back: Normal range of motion and neck supple.     Comments: Hips are stable to pressure.  Sharp pain in the right thigh is elicited with any passive range of motion of the left hip.  DP and PT pulse 2+ bilaterally with normal sensation.  Skin:    General: Skin is warm and dry.  Neurological:     Mental Status: He is alert.  Psychiatric:        Behavior: Behavior normal.     ED Results / Procedures / Treatments   Labs (all labs ordered are listed, but only abnormal results are displayed) Labs Reviewed - No data to display  EKG None  Radiology No results found.  Procedures Procedures    Medications Ordered in ED Medications - No data to display  ED Course/ Medical Decision Making/ A&P  Medical Decision Making Amount and/or Complexity of Data Reviewed Labs: ordered. Radiology: ordered and independent interpretation performed. ECG/medicine tests: ordered and independent interpretation performed.  Risk Prescription drug management. Parenteral controlled substances. Decision regarding hospitalization. Emergency major surgery.   83 year old male who presents after mechanical fall without head injury. I ordered imaging including lumbar pelvis and femur x-ray which shows a right subcapital femoral neck fracture. No evidence of pelvic fracture lumbar spine fracture.  Visualized and interpreted images including right femur , pelvis and spine.  .  EKG shows sinus bradycardia at a rate of 57 with sinus arrhythmia no other acute findings.  I ordered and interpreted labs including CBC which shows white blood cell count of 15.6 and BMP which shows glucose  of 111 likely acute phase reaction in the setting of acute hip fracture.  Case discussed with PA Steffanie Edouard for admission.  Case August so discussed with Dr. Melvin for admission.         Final Clinical Impression(s) / ED Diagnoses Final diagnoses:  None    Rx / DC Orders ED Discharge Orders     None         Tama Fails, PA-C 02/29/24 1514    Burnette Carte, MD 03/01/24 [id number]

## 2024-02-29 NOTE — ED Triage Notes (Signed)
 Pt presents to ED via EMS from home with report of fall this morning. Right hip and leg pain. Able to bear weight on it but not walk very well. Did not hit head, no LOC, no blood thinners.

## 2024-02-29 NOTE — Plan of Care (Signed)

## 2024-02-29 NOTE — H&P (Signed)
 History and Physical   Dennis Osborn Dennis Osborn:[id number] DOB: [birth year] DOA: 02/29/2024  PCP: Abram Abraham, NP-C   Patient coming from: Home  Chief Complaint: Fall  HPI: Dennis Osborn is a 83 y.o. male with medical history significant of hyperlipidemia, BPH presenting after fall at home.  Fell at home while while walking out side working on something after tripping over a small drain pipe. Landed on his right hip.  Had some pain but was able to get up, but pain worsened and persistent so he called EMS to be evaluated in the ED.  Denies fevers, chills, chest pain, shortness of breath, abdominal pain, constipation, diarrhea, nausea, vomiting.  ED Course: Vital signs in the ED notable for blood pressure in the 170 systolic and heart rate in the 50s.  Lab workup included BMP with bicarb 21, glucose 111.  CBC with leukocytosis to 15.6.  PT and INR normal.  L-spine x-ray showed no acute normality.  Right femur x-ray and pelvis x-ray showed acute right subcapital femoral neck fracture.  Patient received Dilaudid and Zofran  in the ED.  Case was discussed with orthopedic surgery who requested admission to [facility] for surgical correction.  Review of Systems: As per HPI otherwise all other systems reviewed and are negative.  Past Medical History:  Diagnosis Date   Hyperlipidemia     Past Surgical History:  Procedure Laterality Date   APPENDECTOMY     MINOR IRRIGATION AND DEBRIDEMENT OF WOUND Left 01/25/2018   Procedure: LEFT INDEX/LONG/RING FINGER IRRIGATION AND DEBRIDEMENT, REPAIR LACERATIONS, PINNING OF FRACTURES;  Surgeon: Brunilda Capra, MD;  Location: [facility] OR;  Service: Orthopedics;  Laterality: Left;    Social History  reports that he has never smoked. He has never used smokeless tobacco. He reports that he does not drink alcohol. No history on file for drug use.  No Known Allergies  No family history on file.  Prior to Admission medications   Medication Sig Start Date End Date Taking?  Authorizing Provider  Cholecalciferol (VITAMIN D3 PO) Take 1 tablet by mouth daily.    [provider]  donepezil (ARICEPT) 10 MG tablet Take 10 mg by mouth daily. 11/13/17   [provider]  escitalopram (LEXAPRO) 5 MG tablet Take 5 mg by mouth daily. 09/21/23   [provider]  Multiple Vitamin (MULTIVITAMIN WITH MINERALS) TABS tablet Take 1 tablet by mouth daily.    [provider]  rosuvastatin (CRESTOR) 10 MG tablet Take 10 mg by mouth at bedtime. 09/02/23   [provider]    Physical Exam: Vitals:   02/29/24 1031 02/29/24 1032  BP:  (!) 178/76  Pulse:  (!) 54  Resp:  18  Temp:  98.4 F (36.9 C)  TempSrc:  Oral  SpO2:  100%  Weight: 72.6 kg   Height: 5\' 8"  (1.727 m)     Physical Exam Constitutional:      General: He is not in acute distress.    Appearance: Normal appearance.  HENT:     Head: Normocephalic and atraumatic.     Mouth/Throat:     Mouth: Mucous membranes are moist.     Pharynx: Oropharynx is clear.  Eyes:     Extraocular Movements: Extraocular movements intact.     Pupils: Pupils are equal, round, and reactive to light.  Cardiovascular:     Rate and Rhythm: Normal rate and regular rhythm.     Pulses: Normal pulses.     Heart sounds: Normal heart sounds.  Pulmonary:     Effort: Pulmonary effort is normal. No respiratory distress.     Breath sounds: Normal breath sounds.  Abdominal:     General: Bowel sounds are normal. There is no distension.     Palpations: Abdomen is soft.     Tenderness: There is no abdominal tenderness.  Musculoskeletal:        General: No swelling or deformity.     Comments: Bilateral lower extremities neurovascularly tact.  Right lower extremity externally rotated and mildly foreshortened.  Skin:    General: Skin is warm and dry.  Neurological:     General: No focal deficit present.     Mental Status: Mental status is at baseline.    Labs on Admission: I have personally reviewed  following labs and imaging studies  CBC: Recent Labs  Lab 02/29/24 1119  WBC 15.6*  NEUTROABS 13.4*  HGB 14.7  HCT 43.4  MCV 93.5  PLT 246    Basic Metabolic Panel: Recent Labs  Lab 02/29/24 1119  NA 139  K 4.0  CL 105  CO2 21*  GLUCOSE 111*  BUN 18  CREATININE 1.12  CALCIUM 9.2    GFR: Estimated Creatinine Clearance: 47.5 mL/min (by C-G formula based on SCr of 1.12 mg/dL).  Liver Function Tests: No results for input(s): "AST", "ALT", "ALKPHOS", "BILITOT", "PROT", "ALBUMIN" in the last 168 hours.  Urine analysis:    Component Value Date/Time   COLORURINE YELLOW 10/15/2023 1246   APPEARANCEUR CLEAR 10/15/2023 1246   LABSPEC 1.010 10/15/2023 1246   PHURINE 6.0 10/15/2023 1246   GLUCOSEU NEGATIVE 10/15/2023 1246   HGBUR NEGATIVE 10/15/2023 1246   BILIRUBINUR NEGATIVE 10/15/2023 1246   KETONESUR NEGATIVE 10/15/2023 1246   PROTEINUR NEGATIVE 10/15/2023 1246   NITRITE NEGATIVE 10/15/2023 1246   LEUKOCYTESUR NEGATIVE 10/15/2023 1246    Radiological Exams on Admission: DG Femur Min 2 Views Right Result Date: 02/29/2024 CLINICAL DATA:  Fall, acute right hip pain EXAM: RIGHT FEMUR 2 VIEWS COMPARISON:  02/29/2024 FINDINGS: Bones are osteopenic. There is an acute angulated and minimally displaced right hip subcapital femoral neck fracture. No associated hip joint malalignment, subluxation or dislocation. Included pelvis intact. No diastasis. SI joints maintained. Peripheral vascular calcifications noted. IMPRESSION: Acute right hip subcapital femoral neck fracture. Electronically Signed   By: Melven Stable.  Shick M.D.   On: 02/29/2024 13:04   DG Pelvis 1-2 Views Result Date: 02/29/2024 CLINICAL DATA:  Recent fall, acute right hip pain EXAM: PELVIS - 1-2 VIEW COMPARISON:  02/29/2024 FINDINGS: Bones are osteopenic. There is an acute minimally displaced and angulated fracture of the right hip subcapital femoral neck. Bony pelvis and left hip appear intact. No diastasis. SI joints are  maintained. Soft tissues unremarkable. IMPRESSION: Acute right hip subcapital femoral neck fracture. Electronically Signed   By: Melven Stable.  Shick M.D.   On: 02/29/2024 13:03   DG Lumbar Spine Complete Result Date: 02/29/2024 CLINICAL DATA:  Recent fall earlier this morning, back and hip pain EXAM: LUMBAR SPINE - COMPLETE 4+ VIEW COMPARISON:  06/13/2017 thoracic spine MRI FINDINGS: Bones are osteopenic. 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 Mild posterior facet arthropathy spanning L3-S1. Preserved lumbar vertebral body heights. No acute compression fracture, wedge-shaped deformity or focal kyphosis. No pars defects. Chronic T12 compression deformity noted. SI joints are maintained. IMPRESSION: 1. Osteopenia and degenerative changes as above. 2. No acute finding by plain radiography. Electronically Signed   By: Melven Stable.  Shick M.D.   On: 02/29/2024 13:01   EKG: Independently reviewed.  Sinus bradycardia at 57 beats minute.  Nonspecific T wave flattening.  Assessment/Plan Principal Problem:   Closed subcapital fracture of femur (HCC)   Right hip fracture Fall > Fall at home landing on his hip with subcapital femoral neck fracture on imaging. > Received pain medication in the ED and orthopedic was consulted and requested admission was not long. - Monitor on MedSurg unit with continuous pulse ox and was sent long - Appreciate orthopedic surgery recommendations and assistance - Pain control as needed - N.p.o. at midnight - Hip fracture protocol  Hyperlipidemia - Continue rosuvastatin  BPH - No longer on medications for this  Confirm additional home medications  DVT prophylaxis: SCDs Code Status:   Full Family Communication:  Updated at bedside Disposition Plan:   Patient is from:  Home  Anticipated DC to:  Home  Anticipated DC date:  2 to 3 days  Anticipated DC barriers: None  Consults called:   Orthopedic surgery Admission status:  Inpatient, MedSurg  Severity of Illness: The appropriate patient status for this patient is INPATIENT. Inpatient status is judged to be reasonable and necessary in order to provide the required intensity of service to ensure the patient's safety. The patient's presenting symptoms, physical exam findings, and initial radiographic and laboratory data in the context of their chronic comorbidities is felt to place them at high risk for further clinical deterioration. Furthermore, it is not anticipated that the patient will be medically stable for discharge from the hospital within 2 midnights of admission.   * I certify that at the point of admission it is my clinical judgment that the patient will require inpatient hospital care spanning beyond 2 midnights from the point of admission due to high intensity of service, high risk for further deterioration and high frequency of surveillance required.Johnetta Nab MD [facility]  How to contact the [facility] Attending or Consulting provider 7A - 7P or covering provider during after hours 7P -7A, for this patient?   Check the care team in [facility] and look for a) attending/consulting [facility] provider listed and b) the [facility] team listed Log into [email or web address] and use [place]'s universal password to access. If you do not have the password, please contact the hospital operator. Locate the [facility] provider you are looking for under [facility] and page to a number that you can be directly reached. If you still have difficulty reaching the provider, please page the [facility] (Director on Call) for the Hospitalists listed on amion for assistance.  02/29/2024, 1:35 PM

## 2024-02-29 NOTE — ED Notes (Signed)
 [facility] arrived to pick up PT. Report given to receiving RN at [facility]. PRN dilaudid also administered before leaving. Daughter notified.

## 2024-02-29 NOTE — ED Notes (Signed)
 Patient transported to X-ray

## 2024-02-29 NOTE — H&P (View-Only) (Signed)
 Reason for Consult:Right hip fx Referring Physician: Sandy Crumb Time called: 1315 Time at bedside: 1326   Dennis Osborn is an 83 y.o. male.  HPI: [name] fell at home while trying to fix a clogged drain. He had immediate right hip pain and could not get up. He managed to crawl inside and call for help. He was brought to the ED where x-rays showed a right hip fx and orthopedic surgery was consulted. He lives at home alone and does not use any assistive devices to ambulate.  Past Medical History:  Diagnosis Date   Hyperlipidemia     Past Surgical History:  Procedure Laterality Date   APPENDECTOMY     MINOR IRRIGATION AND DEBRIDEMENT OF WOUND Left 01/25/2018   Procedure: LEFT INDEX/LONG/RING FINGER IRRIGATION AND DEBRIDEMENT, REPAIR LACERATIONS, PINNING OF FRACTURES;  Surgeon: Brunilda Capra, MD;  Location: [facility] OR;  Service: Orthopedics;  Laterality: Left;    No family history on file.  Social History:  reports that he has never smoked. He has never used smokeless tobacco. He reports that he does not drink alcohol. No history on file for drug use.  Allergies: No Known Allergies  Medications: I have reviewed the patient's current medications.  Results for orders placed or performed during the hospital encounter of 02/29/24 (from the past 48 hours)  Basic metabolic panel     Status: Abnormal   Collection Time: 02/29/24 11:19 AM  Result Value Ref Range   Sodium 139 135 - 145 mmol/L   Potassium 4.0 3.5 - 5.1 mmol/L   Chloride 105 98 - 111 mmol/L   CO2 21 (L) 22 - 32 mmol/L   Glucose, Bld 111 (H) 70 - 99 mg/dL    Comment: Glucose reference range applies only to samples taken after fasting for at least 8 hours.   BUN 18 8 - 23 mg/dL   Creatinine, Ser 8.65 0.61 - 1.24 mg/dL   Calcium 9.2 8.9 - 78.4 mg/dL   GFR, Estimated >69 >62 mL/min    Comment: (NOTE) Calculated using the CKD-EPI Creatinine Equation (2021)    Anion gap 13 5 - 15    Comment: Performed at [facility],  [address], [place], [place] [address]  CBC with Differential     Status: Abnormal   Collection Time: 02/29/24 11:19 AM  Result Value Ref Range   WBC 15.6 (H) 4.0 - 10.5 K/uL   RBC 4.64 4.22 - 5.81 MIL/uL   Hemoglobin 14.7 13.0 - 17.0 g/dL   HCT 13.2 44.0 - 10.2 %   MCV 93.5 80.0 - 100.0 fL   MCH 31.7 26.0 - 34.0 pg   MCHC 33.9 30.0 - 36.0 g/dL   RDW 72.5 36.6 - 44.0 %   Platelets 246 150 - 400 K/uL   nRBC 0.0 0.0 - 0.2 %   Neutrophils Relative % 86 %   Neutro Abs 13.4 (H) 1.7 - 7.7 K/uL   Lymphocytes Relative 9 %   Lymphs Abs 1.4 0.7 - 4.0 K/uL   Monocytes Relative 4 %   Monocytes Absolute 0.7 0.1 - 1.0 K/uL   Eosinophils Relative 0 %   Eosinophils Absolute 0.0 0.0 - 0.5 K/uL   Basophils Relative 0 %   Basophils Absolute 0.1 0.0 - 0.1 K/uL   Immature Granulocytes 1 %   Abs Immature Granulocytes 0.08 (H) 0.00 - 0.07 K/uL    Comment: Performed at [facility], [address], [place], [place] [address]  Protime-INR  Status: None   Collection Time: 02/29/24 11:19 AM  Result Value Ref Range   Prothrombin Time 13.8 11.4 - 15.2 seconds   INR 1.0 0.8 - 1.2    Comment: (NOTE) INR goal varies based on device and disease states. Performed at [facility], [address], [place], [place] [address]   Type and screen [facility]     Status: None   Collection Time: 02/29/24 11:25 AM  Result Value Ref Range   ABO/RH(D) A POS    Antibody Screen NEG    Sample Expiration      03/03/2024,2359 Performed at [facility], [address], [place], [place] [address]   ABO/Rh     Status: None   Collection Time: 02/29/24 11:30 AM  Result Value Ref Range   ABO/RH(D)      A POS Performed at [facility], [address] [place], [place] [address]     DG Femur Min 2 Views Right Result Date: 02/29/2024 CLINICAL DATA:  Fall, acute right hip pain EXAM: RIGHT FEMUR 2 VIEWS COMPARISON:  02/29/2024 FINDINGS: Bones are osteopenic. There is an acute  angulated and minimally displaced right hip subcapital femoral neck fracture. No associated hip joint malalignment, subluxation or dislocation. Included pelvis intact. No diastasis. SI joints maintained. Peripheral vascular calcifications noted. IMPRESSION: Acute right hip subcapital femoral neck fracture. Electronically Signed   By: Melven Stable.  Shick M.D.   On: 02/29/2024 13:04   DG Pelvis 1-2 Views Result Date: 02/29/2024 CLINICAL DATA:  Recent fall, acute right hip pain EXAM: PELVIS - 1-2 VIEW COMPARISON:  02/29/2024 FINDINGS: Bones are osteopenic. There is an acute minimally displaced and angulated fracture of the right hip subcapital femoral neck. Bony pelvis and left hip appear intact. No diastasis. SI joints are maintained. Soft tissues unremarkable. IMPRESSION: Acute right hip subcapital femoral neck fracture. Electronically Signed   By: Melven Stable.  Shick M.D.   On: 02/29/2024 13:03   DG Lumbar Spine Complete Result Date: 02/29/2024 CLINICAL DATA:  Recent fall earlier this morning, back and hip pain EXAM: LUMBAR SPINE - COMPLETE 4+ VIEW COMPARISON:  06/13/2017 thoracic spine MRI FINDINGS: Bones are osteopenic. 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 Mild posterior facet arthropathy spanning L3-S1. Preserved lumbar vertebral body heights. No acute compression fracture, wedge-shaped deformity or focal kyphosis. No pars defects. Chronic T12 compression deformity noted. SI joints are maintained. IMPRESSION: 1. Osteopenia and degenerative changes as above. 2. No acute finding by plain radiography. Electronically Signed   By: Melven Stable.  Shick M.D.   On: 02/29/2024 13:01    Review of Systems  HENT:  Negative for ear discharge, ear pain, hearing loss and tinnitus.   Eyes:  Negative for photophobia and pain.  Respiratory:  Negative for cough and shortness of breath.   Cardiovascular:  Negative for chest pain.   Gastrointestinal:  Negative for abdominal pain, nausea and vomiting.  Genitourinary:  Negative for dysuria, flank pain, frequency and urgency.  Musculoskeletal:  Positive for arthralgias (Right hip). Negative for back pain, myalgias and neck pain.  Neurological:  Negative for dizziness and headaches.  Hematological:  Does not bruise/bleed easily.  Psychiatric/Behavioral:  The patient is not nervous/anxious.    Blood pressure (!) 178/76, pulse (!) 54, temperature 98.4 F (36.9 C), temperature source Oral, resp. rate 18, height 5\' 8"  (1.727 m), weight 72.6 kg, SpO2 100%. Physical Exam Constitutional:  General: He is not in acute distress.    Appearance: He is well-developed. He is not diaphoretic.  HENT:     Head: Normocephalic and atraumatic.  Eyes:     General: No scleral icterus.       Right eye: No discharge.        Left eye: No discharge.     Conjunctiva/sclera: Conjunctivae normal.  Cardiovascular:     Rate and Rhythm: Normal rate and regular rhythm.  Pulmonary:     Effort: Pulmonary effort is normal. No respiratory distress.  Musculoskeletal:     Cervical back: Normal range of motion.     Comments: RLE No traumatic wounds, ecchymosis, or rash  Mod TTP hip  No knee or ankle effusion  Knee stable to varus/ valgus and anterior/posterior stress  Sens DPN, SPN, TN intact  Motor EHL, ext, flex, evers 5/5  DP 2+, PT 2+, No significant edema  Skin:    General: Skin is warm and dry.  Neurological:     Mental Status: He is alert.  Psychiatric:        Mood and Affect: Mood normal.        Behavior: Behavior normal.     Assessment/Plan: Right hip fx -- Plan THA tomorrow with Dr. Charol Copas at [facility]. Please keep NPO after MN.    Georganna Kin, PA-C Orthopedic Surgery [phone] 02/29/2024, 1:44 PM

## 2024-02-29 NOTE — Consult Note (Signed)
 Reason for Consult:Right hip fx Referring Physician: Sandy Crumb Time called: 1315 Time at bedside: 1326   Dennis Osborn is an 83 y.o. male.  HPI: Dennis Osborn fell at home while trying to fix a clogged drain. He had immediate right hip pain and could not get up. He managed to crawl inside and call for help. He was brought to the ED where x-rays showed a right hip fx and orthopedic surgery was consulted. He lives at home alone and does not use any assistive devices to ambulate.  Past Medical History:  Diagnosis Date   Hyperlipidemia     Past Surgical History:  Procedure Laterality Date   APPENDECTOMY     MINOR IRRIGATION AND DEBRIDEMENT OF WOUND Left 01/25/2018   Procedure: LEFT INDEX/LONG/RING FINGER IRRIGATION AND DEBRIDEMENT, REPAIR LACERATIONS, PINNING OF FRACTURES;  Surgeon: Brunilda Capra, MD;  Location: [facility] OR;  Service: Orthopedics;  Laterality: Left;    No family history on file.  Social History:  reports that he has never smoked. He has never used smokeless tobacco. He reports that he does not drink alcohol. No history on file for drug use.  Allergies: No Known Allergies  Medications: I have reviewed the patient's current medications.  Results for orders placed or performed during the hospital encounter of 02/29/24 (from the past 48 hours)  Basic metabolic panel     Status: Abnormal   Collection Time: 02/29/24 11:19 AM  Result Value Ref Range   Sodium 139 135 - 145 mmol/L   Potassium 4.0 3.5 - 5.1 mmol/L   Chloride 105 98 - 111 mmol/L   CO2 21 (L) 22 - 32 mmol/L   Glucose, Bld 111 (H) 70 - 99 mg/dL    Comment: Glucose reference range applies only to samples taken after fasting for at least 8 hours.   BUN 18 8 - 23 mg/dL   Creatinine, Ser 8.65 0.61 - 1.24 mg/dL   Calcium 9.2 8.9 - 78.4 mg/dL   GFR, Estimated >69 >62 mL/min    Comment: (NOTE) Calculated using the CKD-EPI Creatinine Equation (2021)    Anion gap 13 5 - 15    Comment: Performed at [facility],  [address], [place], [place] [address]  CBC with Differential     Status: Abnormal   Collection Time: 02/29/24 11:19 AM  Result Value Ref Range   WBC 15.6 (H) 4.0 - 10.5 K/uL   RBC 4.64 4.22 - 5.81 MIL/uL   Hemoglobin 14.7 13.0 - 17.0 g/dL   HCT 13.2 44.0 - 10.2 %   MCV 93.5 80.0 - 100.0 fL   MCH 31.7 26.0 - 34.0 pg   MCHC 33.9 30.0 - 36.0 g/dL   RDW 72.5 36.6 - 44.0 %   Platelets 246 150 - 400 K/uL   nRBC 0.0 0.0 - 0.2 %   Neutrophils Relative % 86 %   Neutro Abs 13.4 (H) 1.7 - 7.7 K/uL   Lymphocytes Relative 9 %   Lymphs Abs 1.4 0.7 - 4.0 K/uL   Monocytes Relative 4 %   Monocytes Absolute 0.7 0.1 - 1.0 K/uL   Eosinophils Relative 0 %   Eosinophils Absolute 0.0 0.0 - 0.5 K/uL   Basophils Relative 0 %   Basophils Absolute 0.1 0.0 - 0.1 K/uL   Immature Granulocytes 1 %   Abs Immature Granulocytes 0.08 (H) 0.00 - 0.07 K/uL    Comment: Performed at [facility], [address], [place], [place] [address]  Protime-INR  Status: None   Collection Time: 02/29/24 11:19 AM  Result Value Ref Range   Prothrombin Time 13.8 11.4 - 15.2 seconds   INR 1.0 0.8 - 1.2    Comment: (NOTE) INR goal varies based on device and disease states. Performed at [facility], [address], [place], [place] [address]   Type and screen [facility]     Status: None   Collection Time: 02/29/24 11:25 AM  Result Value Ref Range   ABO/RH(D) A POS    Antibody Screen NEG    Sample Expiration      03/03/2024,2359 Performed at [facility], [address], [place], [place] [address]   ABO/Rh     Status: None   Collection Time: 02/29/24 11:30 AM  Result Value Ref Range   ABO/RH(D)      A POS Performed at [facility], [address] [place], [place] [address]     DG Femur Min 2 Views Right Result Date: 02/29/2024 CLINICAL DATA:  Fall, acute right hip pain EXAM: RIGHT FEMUR 2 VIEWS COMPARISON:  02/29/2024 FINDINGS: Bones are osteopenic. There is an acute  angulated and minimally displaced right hip subcapital femoral neck fracture. No associated hip joint malalignment, subluxation or dislocation. Included pelvis intact. No diastasis. SI joints maintained. Peripheral vascular calcifications noted. IMPRESSION: Acute right hip subcapital femoral neck fracture. Electronically Signed   By: Melven Stable.  Shick M.D.   On: 02/29/2024 13:04   DG Pelvis 1-2 Views Result Date: 02/29/2024 CLINICAL DATA:  Recent fall, acute right hip pain EXAM: PELVIS - 1-2 VIEW COMPARISON:  02/29/2024 FINDINGS: Bones are osteopenic. There is an acute minimally displaced and angulated fracture of the right hip subcapital femoral neck. Bony pelvis and left hip appear intact. No diastasis. SI joints are maintained. Soft tissues unremarkable. IMPRESSION: Acute right hip subcapital femoral neck fracture. Electronically Signed   By: Melven Stable.  Shick M.D.   On: 02/29/2024 13:03   DG Lumbar Spine Complete Result Date: 02/29/2024 CLINICAL DATA:  Recent fall earlier this morning, back and hip pain EXAM: LUMBAR SPINE - COMPLETE 4+ VIEW COMPARISON:  06/13/2017 thoracic spine MRI FINDINGS: Bones are osteopenic. 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 Mild posterior facet arthropathy spanning L3-S1. Preserved lumbar vertebral body heights. No acute compression fracture, wedge-shaped deformity or focal kyphosis. No pars defects. Chronic T12 compression deformity noted. SI joints are maintained. IMPRESSION: 1. Osteopenia and degenerative changes as above. 2. No acute finding by plain radiography. Electronically Signed   By: Melven Stable.  Shick M.D.   On: 02/29/2024 13:01    Review of Systems  HENT:  Negative for ear discharge, ear pain, hearing loss and tinnitus.   Eyes:  Negative for photophobia and pain.  Respiratory:  Negative for cough and shortness of breath.   Cardiovascular:  Negative for chest pain.   Gastrointestinal:  Negative for abdominal pain, nausea and vomiting.  Genitourinary:  Negative for dysuria, flank pain, frequency and urgency.  Musculoskeletal:  Positive for arthralgias (Right hip). Negative for back pain, myalgias and neck pain.  Neurological:  Negative for dizziness and headaches.  Hematological:  Does not bruise/bleed easily.  Psychiatric/Behavioral:  The patient is not nervous/anxious.    Blood pressure (!) 178/76, pulse (!) 54, temperature 98.4 F (36.9 C), temperature source Oral, resp. rate 18, height 5\' 8"  (1.727 m), weight 72.6 kg, SpO2 100%. Physical Exam Constitutional:  General: He is not in acute distress.    Appearance: He is well-developed. He is not diaphoretic.  HENT:     Head: Normocephalic and atraumatic.  Eyes:     General: No scleral icterus.       Right eye: No discharge.        Left eye: No discharge.     Conjunctiva/sclera: Conjunctivae normal.  Cardiovascular:     Rate and Rhythm: Normal rate and regular rhythm.  Pulmonary:     Effort: Pulmonary effort is normal. No respiratory distress.  Musculoskeletal:     Cervical back: Normal range of motion.     Comments: RLE No traumatic wounds, ecchymosis, or rash  Mod TTP hip  No knee or ankle effusion  Knee stable to varus/ valgus and anterior/posterior stress  Sens DPN, SPN, TN intact  Motor EHL, ext, flex, evers 5/5  DP 2+, PT 2+, No significant edema  Skin:    General: Skin is warm and dry.  Neurological:     Mental Status: He is alert.  Psychiatric:        Mood and Affect: Mood normal.        Behavior: Behavior normal.     Assessment/Plan: Right hip fx -- Plan THA tomorrow with Dr. Charol Copas at [facility]. Please keep NPO after MN.    Georganna Kin, PA-C Orthopedic Surgery [phone] 02/29/2024, 1:44 PM

## 2024-02-29 NOTE — ED Notes (Signed)
 Called [facility] for transport. No ETA given at this time.

## 2024-03-01 ENCOUNTER — Inpatient Hospital Stay (HOSPITAL_COMMUNITY): Admitting: Anesthesiology

## 2024-03-01 ENCOUNTER — Inpatient Hospital Stay (HOSPITAL_COMMUNITY)

## 2024-03-01 ENCOUNTER — Other Ambulatory Visit: Payer: Self-pay

## 2024-03-01 ENCOUNTER — Encounter (HOSPITAL_COMMUNITY)
Admission: EM | Disposition: A | Discharge status: Skilled Nursing Facility | Payer: Self-pay | Source: Home / Self Care | Attending: Internal Medicine

## 2024-03-01 DIAGNOSIS — S72001A Fracture of unspecified part of neck of right femur, initial encounter for closed fracture: Secondary | ICD-10-CM

## 2024-03-01 DIAGNOSIS — S72011A Unspecified intracapsular fracture of right femur, initial encounter for closed fracture: Secondary | ICD-10-CM | POA: Diagnosis not present

## 2024-03-01 HISTORY — PX: TOTAL HIP ARTHROPLASTY: SHX124

## 2024-03-01 LAB — TYPE AND SCREEN
ABO/RH(D): A POS
Antibody Screen: NEGATIVE

## 2024-03-01 LAB — BASIC METABOLIC PANEL WITH GFR
Anion gap: 10 (ref 5–15)
BUN: 28 mg/dL — ABNORMAL HIGH (ref 8–23)
CO2: 22 mmol/L (ref 22–32)
Calcium: 8.6 mg/dL — ABNORMAL LOW (ref 8.9–10.3)
Chloride: 105 mmol/L (ref 98–111)
Creatinine, Ser: 0.99 mg/dL (ref 0.61–1.24)
GFR, Estimated: >60 mL/min (ref >60)
Glucose, Bld: 123 mg/dL — ABNORMAL HIGH (ref 70–99)
Potassium: 4 mmol/L (ref 3.5–5.1)
Sodium: 137 mmol/L (ref 135–145)

## 2024-03-01 LAB — CBC
HCT: 41.5 % (ref 39.0–52.0)
Hemoglobin: 13.3 g/dL (ref 13.0–17.0)
MCH: 31.4 pg (ref 26.0–34.0)
MCHC: 32 g/dL (ref 30.0–36.0)
MCV: 98.1 fL (ref 80.0–100.0)
Platelets: 170 10*3/uL (ref 150–400)
RBC: 4.23 MIL/uL (ref 4.22–5.81)
RDW: 13.5 % (ref 11.5–15.5)
WBC: 12.3 10*3/uL — ABNORMAL HIGH (ref 4.0–10.5)
nRBC: 0 % (ref 0.0–0.2)

## 2024-03-01 SURGERY — ARTHROPLASTY, HIP, TOTAL, ANTERIOR APPROACH
Anesthesia: Spinal | Site: Hip | Laterality: Right

## 2024-03-01 MED ORDER — SODIUM CHLORIDE (PF) 0.9 % IJ SOLN
INTRAMUSCULAR | Status: DC | PRN
  Administered 2024-03-01: 61 mL

## 2024-03-01 MED ORDER — ONDANSETRON HCL 4 MG/2ML IJ SOLN: 4.0000 mg | Freq: Once | INTRAMUSCULAR | Status: DC | PRN

## 2024-03-01 MED ORDER — PHENYLEPHRINE HCL-NACL 20-0.9 MG/250ML-% IV SOLN
INTRAVENOUS | Status: DC | PRN
  Administered 2024-03-01: 25 ug/min via INTRAVENOUS

## 2024-03-01 MED ORDER — SENNA 8.6 MG PO TABS
1.0000 | ORAL_TABLET | Freq: Two times a day (BID) | ORAL | Status: DC
  Administered 2024-03-01 – 2024-03-10 (×19): 8.6 mg via ORAL
  Filled 2024-03-01 (×19): qty 1

## 2024-03-01 MED ORDER — ASPIRIN 81 MG PO CHEW
81.0000 mg | CHEWABLE_TABLET | Freq: Two times a day (BID) | ORAL | Status: DC
  Administered 2024-03-01 – 2024-03-10 (×19): 81 mg via ORAL
  Filled 2024-03-01 (×19): qty 1

## 2024-03-01 MED ORDER — WATER FOR IRRIGATION, STERILE IR SOLN
Status: DC | PRN
  Administered 2024-03-01: 1000 mL

## 2024-03-01 MED ORDER — CHLORHEXIDINE GLUCONATE 4 % EX SOLN: 60.0000 mL | Freq: Once | CUTANEOUS | Status: DC

## 2024-03-01 MED ORDER — PROPOFOL 1000 MG/100ML IV EMUL
INTRAVENOUS | Status: AC
  Filled 2024-03-01: qty 100

## 2024-03-01 MED ORDER — LIDOCAINE HCL (PF) 2 % IJ SOLN
INTRAMUSCULAR | Status: AC
  Filled 2024-03-01: qty 5

## 2024-03-01 MED ORDER — METOCLOPRAMIDE HCL 5 MG PO TABS: 5.0000 mg | ORAL_TABLET | Freq: Three times a day (TID) | ORAL | Status: DC | PRN

## 2024-03-01 MED ORDER — METOCLOPRAMIDE HCL 5 MG/ML IJ SOLN: 5.0000 mg | Freq: Three times a day (TID) | INTRAMUSCULAR | Status: DC | PRN

## 2024-03-01 MED ORDER — FENTANYL CITRATE (PF) 100 MCG/2ML IJ SOLN
INTRAMUSCULAR | Status: DC | PRN
  Administered 2024-03-01: 25 ug via INTRAVENOUS

## 2024-03-01 MED ORDER — TRANEXAMIC ACID-NACL 1000-0.7 MG/100ML-% IV SOLN
1000.0000 mg | INTRAVENOUS | Status: AC
  Administered 2024-03-01: 1000 mg via INTRAVENOUS
  Filled 2024-03-01: qty 100

## 2024-03-01 MED ORDER — DOCUSATE SODIUM 100 MG PO CAPS
100.0000 mg | ORAL_CAPSULE | Freq: Two times a day (BID) | ORAL | Status: DC
  Administered 2024-03-01 – 2024-03-03 (×4): 100 mg via ORAL
  Filled 2024-03-01 (×4): qty 1

## 2024-03-01 MED ORDER — ACETAMINOPHEN 500 MG PO TABS
1000.0000 mg | ORAL_TABLET | ORAL | Status: AC
  Administered 2024-03-01: 1000 mg via ORAL

## 2024-03-01 MED ORDER — MENTHOL 3 MG MT LOZG: 1.0000 | LOZENGE | OROMUCOSAL | Status: DC | PRN

## 2024-03-01 MED ORDER — PANTOPRAZOLE SODIUM 40 MG PO TBEC
40.0000 mg | DELAYED_RELEASE_TABLET | Freq: Every day | ORAL | Status: DC
  Administered 2024-03-02 – 2024-03-10 (×9): 40 mg via ORAL
  Filled 2024-03-01 (×10): qty 1

## 2024-03-01 MED ORDER — POLYETHYLENE GLYCOL 3350 17 G PO PACK
17.0000 g | PACK | Freq: Every day | ORAL | Status: DC | PRN
  Administered 2024-03-03: 17 g via ORAL
  Filled 2024-03-01: qty 1

## 2024-03-01 MED ORDER — ALUM & MAG HYDROXIDE-SIMETH 200-200-20 MG/5ML PO SUSP: 30.0000 mL | ORAL | Status: DC | PRN

## 2024-03-01 MED ORDER — LACTATED RINGERS IV SOLN: INTRAVENOUS | Status: DC | PRN

## 2024-03-01 MED ORDER — FENTANYL CITRATE (PF) 100 MCG/2ML IJ SOLN
INTRAMUSCULAR | Status: AC
  Filled 2024-03-01: qty 2

## 2024-03-01 MED ORDER — ONDANSETRON HCL 4 MG PO TABS: 4.0000 mg | ORAL_TABLET | Freq: Four times a day (QID) | ORAL | Status: DC | PRN

## 2024-03-01 MED ORDER — DEXAMETHASONE SODIUM PHOSPHATE 10 MG/ML IJ SOLN
INTRAMUSCULAR | Status: AC
  Filled 2024-03-01: qty 1

## 2024-03-01 MED ORDER — CEFAZOLIN SODIUM-DEXTROSE 2-4 GM/100ML-% IV SOLN
2.0000 g | INTRAVENOUS | Status: AC
  Administered 2024-03-01: 2 g via INTRAVENOUS
  Filled 2024-03-01: qty 100

## 2024-03-01 MED ORDER — SODIUM CHLORIDE (PF) 0.9 % IJ SOLN
INTRAMUSCULAR | Status: AC
  Filled 2024-03-01: qty 30

## 2024-03-01 MED ORDER — ISOPROPYL ALCOHOL 70 % SOLN
Status: DC | PRN
  Administered 2024-03-01: 1 via TOPICAL

## 2024-03-01 MED ORDER — ONDANSETRON HCL 4 MG/2ML IJ SOLN
INTRAMUSCULAR | Status: AC
  Filled 2024-03-01: qty 2

## 2024-03-01 MED ORDER — ACETAMINOPHEN 325 MG PO TABS
325.0000 mg | ORAL_TABLET | Freq: Four times a day (QID) | ORAL | Status: DC | PRN
  Administered 2024-03-03 – 2024-03-09 (×8): 650 mg via ORAL
  Filled 2024-03-01 (×8): qty 2

## 2024-03-01 MED ORDER — LIDOCAINE HCL (CARDIAC) PF 100 MG/5ML IV SOSY
PREFILLED_SYRINGE | INTRAVENOUS | Status: DC | PRN
  Administered 2024-03-01: 30 mg via INTRAVENOUS

## 2024-03-01 MED ORDER — MORPHINE SULFATE (PF) 2 MG/ML IV SOLN
0.5000 mg | INTRAVENOUS | Status: DC | PRN
  Administered 2024-03-03: 1 mg via INTRAVENOUS
  Filled 2024-03-01: qty 1

## 2024-03-01 MED ORDER — PHENOL 1.4 % MT LIQD: 1.0000 | OROMUCOSAL | Status: DC | PRN

## 2024-03-01 MED ORDER — LACTATED RINGERS IV SOLN: INTRAVENOUS | Status: DC

## 2024-03-01 MED ORDER — DIPHENHYDRAMINE HCL 12.5 MG/5ML PO ELIX: 12.5000 mg | ORAL_SOLUTION | ORAL | Status: DC | PRN

## 2024-03-01 MED ORDER — BUPIVACAINE IN DEXTROSE 0.75-8.25 % IT SOLN
INTRATHECAL | Status: DC | PRN
Start: 2024-03-01 — End: 2024-03-01
  Administered 2024-03-01: 1.8 mL via INTRATHECAL

## 2024-03-01 MED ORDER — KETOROLAC TROMETHAMINE 30 MG/ML IJ SOLN
INTRAMUSCULAR | Status: AC
  Filled 2024-03-01: qty 1

## 2024-03-01 MED ORDER — KETAMINE HCL 50 MG/5ML IJ SOSY
PREFILLED_SYRINGE | INTRAMUSCULAR | Status: AC
  Filled 2024-03-01: qty 5

## 2024-03-01 MED ORDER — SODIUM CHLORIDE 0.9 % IV SOLN: INTRAVENOUS | Status: DC

## 2024-03-01 MED ORDER — PROPOFOL 500 MG/50ML IV EMUL
INTRAVENOUS | Status: DC | PRN
  Administered 2024-03-01: 75 ug/kg/min via INTRAVENOUS
  Administered 2024-03-01 (×3): 20 mg via INTRAVENOUS

## 2024-03-01 MED ORDER — POVIDONE-IODINE 10 % EX SWAB: 2.0000 | Freq: Once | CUTANEOUS | Status: DC

## 2024-03-01 MED ORDER — BISACODYL 10 MG RE SUPP
10.0000 mg | Freq: Every day | RECTAL | Status: DC | PRN
  Administered 2024-03-03: 10 mg via RECTAL
  Filled 2024-03-01: qty 1

## 2024-03-01 MED ORDER — KETAMINE HCL 10 MG/ML IJ SOLN
INTRAMUSCULAR | Status: DC | PRN
  Administered 2024-03-01: 20 mg via INTRAVENOUS
  Administered 2024-03-01: 10 mg via INTRAVENOUS

## 2024-03-01 MED ORDER — ONDANSETRON HCL 4 MG/2ML IJ SOLN
INTRAMUSCULAR | Status: DC | PRN
  Administered 2024-03-01: 4 mg via INTRAVENOUS

## 2024-03-01 MED ORDER — CHLORHEXIDINE GLUCONATE 0.12 % MT SOLN
15.0000 mL | Freq: Once | OROMUCOSAL | Status: AC
  Administered 2024-03-01: 15 mL via OROMUCOSAL

## 2024-03-01 MED ORDER — ONDANSETRON HCL 4 MG/2ML IJ SOLN: 4.0000 mg | Freq: Four times a day (QID) | INTRAMUSCULAR | Status: DC | PRN

## 2024-03-01 MED ORDER — SODIUM CHLORIDE 0.9 % IR SOLN
Status: DC | PRN
  Administered 2024-03-01: 1000 mL

## 2024-03-01 MED ORDER — CEFAZOLIN SODIUM-DEXTROSE 2-4 GM/100ML-% IV SOLN
2.0000 g | Freq: Four times a day (QID) | INTRAVENOUS | Status: AC
  Administered 2024-03-01 – 2024-03-02 (×2): 2 g via INTRAVENOUS
  Filled 2024-03-01 (×2): qty 100

## 2024-03-01 MED ORDER — METHOCARBAMOL 1000 MG/10ML IJ SOLN: 500.0000 mg | Freq: Four times a day (QID) | INTRAMUSCULAR | Status: DC | PRN

## 2024-03-01 MED ORDER — HYDROCODONE-ACETAMINOPHEN 7.5-325 MG PO TABS: 1.0000 | ORAL_TABLET | ORAL | Status: DC | PRN

## 2024-03-01 MED ORDER — FENTANYL CITRATE PF 50 MCG/ML IJ SOSY
25.0000 ug | PREFILLED_SYRINGE | INTRAMUSCULAR | Status: DC | PRN
Start: 2024-03-01 — End: 2024-03-01

## 2024-03-01 MED ORDER — GLYCOPYRROLATE 0.2 MG/ML IJ SOLN
INTRAMUSCULAR | Status: DC | PRN
  Administered 2024-03-01: .2 mg via INTRAVENOUS

## 2024-03-01 MED ORDER — BUPIVACAINE-EPINEPHRINE (PF) 0.25% -1:200000 IJ SOLN
INTRAMUSCULAR | Status: AC
  Filled 2024-03-01: qty 30

## 2024-03-01 MED ORDER — HYDROCODONE-ACETAMINOPHEN 5-325 MG PO TABS
1.0000 | ORAL_TABLET | ORAL | Status: DC | PRN
  Administered 2024-03-03 (×2): 1 via ORAL
  Filled 2024-03-01 (×2): qty 1

## 2024-03-01 MED ORDER — METHOCARBAMOL 500 MG PO TABS
500.0000 mg | ORAL_TABLET | Freq: Four times a day (QID) | ORAL | Status: DC | PRN
  Administered 2024-03-03 – 2024-03-10 (×9): 500 mg via ORAL
  Filled 2024-03-01 (×11): qty 1

## 2024-03-01 SURGICAL SUPPLY — 49 items
BAG COUNTER SPONGE SURGICOUNT (BAG) IMPLANT
BAG ZIPLOCK 12X15 (MISCELLANEOUS) IMPLANT
CHLORAPREP W/TINT 26 (MISCELLANEOUS) ×1 IMPLANT
COVER PERINEAL POST (MISCELLANEOUS) ×1 IMPLANT
COVER SURGICAL LIGHT HANDLE (MISCELLANEOUS) ×1 IMPLANT
DERMABOND ADVANCED .7 DNX12 (GAUZE/BANDAGES/DRESSINGS) ×2 IMPLANT
DRAPE IMP U-DRAPE 54X76 (DRAPES) ×1 IMPLANT
DRAPE SHEET LG 3/4 BI-LAMINATE (DRAPES) ×3 IMPLANT
DRAPE STERI IOBAN 125X83 (DRAPES) ×1 IMPLANT
DRAPE U-SHAPE 47X51 STRL (DRAPES) ×1 IMPLANT
DRSG AQUACEL AG ADV 3.5X10 (GAUZE/BANDAGES/DRESSINGS) ×1 IMPLANT
ELECT REM PT RETURN 15FT ADLT (MISCELLANEOUS) ×1 IMPLANT
GAUZE SPONGE 4X4 12PLY STRL (GAUZE/BANDAGES/DRESSINGS) ×1 IMPLANT
GLOVE BIO SURGEON STRL SZ7 (GLOVE) ×1 IMPLANT
GLOVE BIO SURGEON STRL SZ8.5 (GLOVE) ×2 IMPLANT
GLOVE BIOGEL PI IND STRL 7.5 (GLOVE) ×1 IMPLANT
GLOVE BIOGEL PI IND STRL 8.5 (GLOVE) ×1 IMPLANT
GOWN SPEC L3 XXLG W/TWL (GOWN DISPOSABLE) ×1 IMPLANT
GOWN STRL REUS W/ TWL XL LVL3 (GOWN DISPOSABLE) ×1 IMPLANT
GOWN STRL REUS W/TWL LRG LVL3 (GOWN DISPOSABLE) IMPLANT
HEAD FEM -3XOFST 36XMDLR (Head) IMPLANT
HOLDER FOLEY CATH W/STRAP (MISCELLANEOUS) ×1 IMPLANT
HOOD PEEL AWAY T7 (MISCELLANEOUS) ×3 IMPLANT
KIT TURNOVER KIT A (KITS) IMPLANT
LINER ACETAB NN G7 F 36 (Liner) IMPLANT
MANIFOLD NEPTUNE II (INSTRUMENTS) ×1 IMPLANT
MARKER SKIN DUAL TIP RULER LAB (MISCELLANEOUS) ×1 IMPLANT
NDL SAFETY ECLIPSE 18X1.5 (NEEDLE) ×1 IMPLANT
NDL SPNL 18GX3.5 QUINCKE PK (NEEDLE) ×1 IMPLANT
NEEDLE SPNL 18GX3.5 QUINCKE PK (NEEDLE) ×1 IMPLANT
PACK ANTERIOR HIP CUSTOM (KITS) ×1 IMPLANT
PENCIL SMOKE EVACUATOR (MISCELLANEOUS) ×1 IMPLANT
SAW OSC TIP CART 19.5X105X1.3 (SAW) ×1 IMPLANT
SEALER BIPOLAR AQUA 6.0 (INSTRUMENTS) ×1 IMPLANT
SET HNDPC FAN SPRY TIP SCT (DISPOSABLE) ×1 IMPLANT
SHELL ACET G7 4H 56 SZF (Shell) IMPLANT
SOLUTION PRONTOSAN WOUND 350ML (IRRIGATION / IRRIGATOR) ×1 IMPLANT
SPIKE FLUID TRANSFER (MISCELLANEOUS) ×1 IMPLANT
STEM FEM CMTL 15X150 133D (Stem) IMPLANT
SUT MNCRL AB 3-0 PS2 18 (SUTURE) ×1 IMPLANT
SUT MON AB 2-0 CT1 36 (SUTURE) ×1 IMPLANT
SUT STRATAFIX 14 PDO 48 VLT (SUTURE) ×1 IMPLANT
SUT STRATAFIX PDO 1 14 VIOLET (SUTURE) ×1 IMPLANT
SUT VIC AB 2-0 CT1 TAPERPNT 27 (SUTURE) IMPLANT
SYR 3ML LL SCALE MARK (SYRINGE) ×1 IMPLANT
TOWEL GREEN STERILE FF (TOWEL DISPOSABLE) ×1 IMPLANT
TRAY FOLEY MTR SLVR 16FR STAT (SET/KITS/TRAYS/PACK) IMPLANT
TUBE SUCTION HIGH CAP CLEAR NV (SUCTIONS) ×1 IMPLANT
WATER STERILE IRR 1000ML POUR (IV SOLUTION) ×1 IMPLANT

## 2024-03-01 NOTE — Op Note (Signed)
 OPERATIVE REPORT  SURGEON: Adonica Hoose, MD   ASSISTANT: Trixie Furnace, PA-C  PREOPERATIVE DIAGNOSIS: Displaced Right femoral neck fracture.   POSTOPERATIVE DIAGNOSIS: Displaced Right femoral neck fracture.   PROCEDURE: Right total hip arthroplasty, anterior approach.   IMPLANTS: Biomet Taperloc Reduced Distal stem, size 15 x 150 mm, high offset. Biomet G7 OsseoTi Cup, size 56 mm. Biomet Vivacit-E liner, size 36 mm, F, neutral. Biomet metal head ball, size 36 - 3 mm.  ANESTHESIA:  MAC and Spinal  ANTIBIOTICS: 2g ancef.  ESTIMATED BLOOD LOSS:-100 mL    DRAINS: None.  COMPLICATIONS: None   CONDITION: PACU - hemodynamically stable.   BRIEF CLINICAL NOTE: Dennis Osborn is a 83 y.o. male with a displaced Right femoral neck fracture. The patient was admitted to the hospitalist service and underwent perioperative risk stratification and medical optimization. The risks, benefits, and alternatives to total hip arthroplasty were explained, and the patient elected to proceed.  PROCEDURE IN DETAIL: The patient was taken to the operating room and general anesthesia was induced on the hospital bed.  The patient was then positioned on the Hana table.  All bony prominences were well padded.  The hip was prepped and draped in the normal sterile surgical fashion.  A time-out was called verifying side and site of surgery. Antibiotics were given within 60 minutes of beginning the procedure.   Bikini incision was made, and the direct anterior approach to the hip was performed through the Hueter interval.  Lateral femoral circumflex vessels were treated with the Auqumantys. The anterior capsule was exposed and an inverted T capsulotomy was made.  Fracture hematoma was encountered and evacuated. The patient was found to have a comminuted Right subcapital femoral neck fracture.  I freshened the femoral neck cut with a saw.  I removed the femoral neck fragment.  A corkscrew was placed into the head and the  head was removed.  This was passed to the back table and was measured. The pubofemoral ligament was released subperiosteally to the lesser trochanter.  Acetabular exposure was achieved, and the pulvinar and labrum were excised. Sequential reaming of the acetabulum was then performed up to a size 55 mm reamer under direct visulization. A 56 mm cup was then opened and impacted into place at approximately 40 degrees of abduction and 20 degrees of anteversion. The final polyethylene liner was impacted into place and acetabular osteophytes were removed.    I then gained femoral exposure taking care to protect the abductors and greater trochanter.  This was performed using standard external rotation, extension, and adduction.  A cookie cutter was used to enter the femoral canal, and then the femoral canal finder was placed.  Sequential broaching was performed up to a size 15.  Calcar planer was used on the femoral neck remnant.  I placed a high offset neck and a trial head ball.  The hip was reduced.  Leg lengths and offset were checked fluoroscopically.  The hip was dislocated and trial components were removed.  The final implants were placed, and the hip was reduced.  Fluoroscopy was used to confirm component position and leg lengths.  At 90 degrees of external rotation and full extension, the hip was stable to an anterior directed force.   The wound was copiously irrigated with Irrisept solution and normal saline using pulse lavage.  Marcaine  solution was injected into the periarticular soft tissue.  The wound was closed in layers using #1 Stratafix for the fascia, 2-0 Vicryl for the subcutaneous fat,  2-0 Monocryl for the deep dermal layer, 3-0 running Monocryl subcuticular stitch, and Dermabond for the skin.  Once the glue was fully dried, an Aquacell Ag dressing was applied.  The patient was transported to the recovery room in stable condition.  Sponge, needle, and instrument counts were correct at the end of  the case x2.  The patient tolerated the procedure well and there were no known complications.  Please note that a surgical assistant was a medical necessity for this procedure to perform it in a safe and expeditious manner. Assistant was necessary to provide appropriate retraction of vital neurovascular structures, to prevent femoral fracture, and to allow for anatomic placement of the prosthesis.

## 2024-03-01 NOTE — Interval H&P Note (Signed)
 History and Physical Interval Note:  03/01/2024 2:11 PM  Dennis Osborn  has presented today for surgery, with the diagnosis of right femoral neck fracture.  The various methods of treatment have been discussed with the patient and family. After consideration of risks, benefits and other options for treatment, the patient has consented to  Procedure(s): ARTHROPLASTY, HIP, TOTAL, ANTERIOR APPROACH (Right) as a surgical intervention.  The patient's history has been reviewed, patient examined, no change in status, stable for surgery.  I have reviewed the patient's chart and labs.  Questions were answered to the patient's satisfaction.    The risks, benefits, and alternatives were discussed with the patient. There are risks associated with the surgery including, but not limited to, problems with anesthesia (death), infection, instability (giving out of the joint), dislocation, differences in leg length/angulation/rotation, fracture of bones, loosening or failure of implants, hematoma (blood accumulation) which may require surgical drainage, blood clots, pulmonary embolism, nerve injury (foot drop and lateral thigh numbness), and blood vessel injury. The patient understands these risks and elects to proceed.   Dennis Osborn Dennis Osborn

## 2024-03-01 NOTE — Transfer of Care (Signed)
 Immediate Anesthesia Transfer of Care Note  Patient: Dennis Osborn  Procedure(s) Performed: ARTHROPLASTY, HIP, TOTAL, ANTERIOR APPROACH (Right: Hip)  Patient Location: PACU  Anesthesia Type:MAC combined with regional for post-op pain  Level of Consciousness: awake and alert   Airway & Oxygen Therapy: Patient Spontanous Breathing and Patient connected to nasal cannula oxygen  Post-op Assessment: Report given to RN and Post -op Vital signs reviewed and stable  Post vital signs: Reviewed and stable  Last Vitals:  Vitals Value Taken Time  BP 116/64 03/01/24 1721  Temp    Pulse 76 03/01/24 1722  Resp 20 03/01/24 1722  SpO2 90 % 03/01/24 1722  Vitals shown include unfiled device data.  Last Pain:  Vitals:   03/01/24 1313  TempSrc:   PainSc: 0-No pain      Patients Stated Pain Goal: 4 (03/01/24 1230)  Complications: No notable events documented.

## 2024-03-01 NOTE — TOC Initial Note (Signed)
 Transition of Care [facility]) - Initial/Assessment Note    Patient Details  Name: Dennis Osborn MRN: [id number] Date of Birth: 1941-04-26  Transition of Care [facility]) CM/SW Contact:    Delilah Fend, LCSW Phone Number: 03/01/2024, 12:31 PM  Clinical Narrative:                 Met with pt and sister, Marily Shows, this morning to introduce TOC/ CSW role with dc planning needs.  Pt awaiting planned surgery today for hip fx.  He confirms that he lives alone and was completely independent prior to this fall.  Daughter and sister are local and supportive.  We discussed potential dc recommendations to expect from PT for either HH/ OP/ SNF.  Pt and sister anticipating he will need SNF for rehab and sister requesting listing of local SNFs - Medicare.gov list provided.  Will follow up with pt and family tomorrow.  Expected Discharge Plan: Skilled Nursing Facility Barriers to Discharge: Continued Medical Work up   Patient Goals and CMS Choice Patient states their goals for this hospitalization and ongoing recovery are:: pt anticipates need for SNF rehab and hopes to return home following          Expected Discharge Plan and Services In-house Referral: Clinical Social Work     Living arrangements for the past 2 months: Single Family Home                                      Prior Living Arrangements/Services Living arrangements for the past 2 months: Single Family Home Lives with:: Self Patient language and need for interpreter reviewed:: Yes Do you feel safe going back to the place where you live?: Yes      Need for Family Participation in Patient Care: Yes (Comment) Care giver support system in place?: Yes (comment)   Criminal Activity/Legal Involvement Pertinent to Current Situation/Hospitalization: No - Comment as needed  Activities of Daily Living   ADL Screening (condition at time of admission) Independently performs ADLs?: Yes (appropriate for developmental age) Is the patient deaf or  have difficulty hearing?: Yes Does the patient have difficulty seeing, even when wearing glasses/contacts?: No Does the patient have difficulty concentrating, remembering, or making decisions?: No  Permission Sought/Granted Permission sought to share information with : Family Supports Permission granted to share information with : Yes, Verbal Permission Granted  Share Information with NAME: daughter, Eleni Griffin @ [phone] or sister, Blenda Burdock @ [phone]           Emotional Assessment Appearance:: Appears stated age Attitude/Demeanor/Rapport: Gracious, Engaged Affect (typically observed): Accepting Orientation: : Oriented to Self, Oriented to Place, Oriented to  Time, Oriented to Situation Alcohol / Substance Use: Not Applicable Psych Involvement: No (comment)  Admission diagnosis:  Closed subcapital fracture of femur (HCC) [S72.019A] Closed fracture of right hip, initial encounter [facility]) [S72.001A] Patient Active Problem List   Diagnosis Date Noted   Closed subcapital fracture of femur (HCC) 02/29/2024   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 [place] [address] Phone: [phone] Fax: [phone]  [facility] [address] [place] [address] Phone: [phone] Fax: [phone]  [facility] - [place], [place] - [address] [place] [address] Phone: [phone] Fax: [phone]  Social Drivers of Health (SDOH) Social History: SDOH Screenings   Food Insecurity: No Food Insecurity (02/29/2024)  Housing: Low Risk  (02/29/2024)  Transportation Needs: No Transportation Needs (02/29/2024)  Utilities: Not At Risk (02/29/2024)  Depression (PHQ2-9): Low Risk  (01/18/2024)  Social Connections: Unknown (02/27/2022)   Received from [facility]   Tobacco Use: Low Risk  (02/29/2024)   SDOH Interventions:     Readmission Risk Interventions    03/01/2024   12:26 PM  Readmission Risk Prevention Plan  Post Dischage Appt Complete  Medication Screening Complete  Transportation Screening Complete

## 2024-03-01 NOTE — Anesthesia Postprocedure Evaluation (Signed)
 Anesthesia Post Note  Patient: Dennis Osborn  Procedure(s) Performed: ARTHROPLASTY, HIP, TOTAL, ANTERIOR APPROACH (Right: Hip)     Patient location during evaluation: PACU Anesthesia Type: Spinal Level of consciousness: awake and alert and oriented Pain management: pain level controlled Vital Signs Assessment: post-procedure vital signs reviewed and stable Respiratory status: spontaneous breathing, nonlabored ventilation and respiratory function stable Cardiovascular status: blood pressure returned to baseline and stable Postop Assessment: no headache, no backache, spinal receding, patient able to bend at knees and no apparent nausea or vomiting Anesthetic complications: no   No notable events documented.  Last Vitals:  Vitals:   03/01/24 1800 03/01/24 1831  BP: (!) 143/57 (!) 164/63  Pulse: (!) 52 (!) 49  Resp: 13 14  Temp: 36.5 C 36.5 C  SpO2: 93% 100%    Last Pain:  Vitals:   03/01/24 1831  TempSrc: Oral  PainSc:                  Jacquelyne Matte

## 2024-03-01 NOTE — Plan of Care (Signed)
   Problem: Education: Goal: Knowledge of General Education information will improve Description: Including pain rating scale, medication(s)/side effects and non-pharmacologic comfort measures Outcome: Progressing   Problem: Activity: Goal: Risk for activity intolerance will decrease Outcome: Progressing   Problem: Pain Managment: Goal: General experience of comfort will improve and/or be controlled Outcome: Progressing

## 2024-03-01 NOTE — Anesthesia Preprocedure Evaluation (Addendum)
 Anesthesia Evaluation  Patient identified by MRN, date of birth, ID band Patient awake    Reviewed: Allergy & Precautions, NPO status , Patient's Chart, lab work & pertinent test results  Airway Mallampati: II  TM Distance: >3 FB Neck ROM: Full    Dental  (+) Teeth Intact, Dental Advisory Given   Pulmonary neg pulmonary ROS   Pulmonary exam normal breath sounds clear to auscultation       Cardiovascular negative cardio ROS Normal cardiovascular exam Rhythm:Regular Rate:Normal     Neuro/Psych negative neurological ROS  negative psych ROS   GI/Hepatic negative GI ROS, Neg liver ROS,,,  Endo/Other  negative endocrine ROS    Renal/GU negative Renal ROS     Musculoskeletal negative musculoskeletal ROS (+)  right femoral neck fracture   Abdominal   Peds  Hematology negative hematology ROS (+) Plt 170k    Anesthesia Other Findings Day of surgery medications reviewed with the patient.  Reproductive/Obstetrics                             Anesthesia Physical Anesthesia Plan  ASA: 3  Anesthesia Plan: Spinal   Post-op Pain Management: Tylenol  PO (pre-op)* and Ketamine IV*   Induction: Intravenous  PONV Risk Score and Plan: 2 and Dexamethasone , Ondansetron  and TIVA  Airway Management Planned: Natural Airway and Simple Face Mask  Additional Equipment:   Intra-op Plan:   Post-operative Plan:   Informed Consent: I have reviewed the patients History and Physical, chart, labs and discussed the procedure including the risks, benefits and alternatives for the proposed anesthesia with the patient or authorized representative who has indicated his/her understanding and acceptance.     Dental advisory given  Plan Discussed with: CRNA  Anesthesia Plan Comments:        Anesthesia Quick Evaluation

## 2024-03-01 NOTE — Progress Notes (Signed)
 PROGRESS NOTE    Dennis Osborn  NGE:[id number] DOB: 29-Jun-1941 DOA: 02/29/2024 PCP: Abram Abraham, NP-C   Brief Narrative:  83 y.o. male with medical history significant of hyperlipidemia, BPH presented with a fall and was found to have acute right subcapital femoral neck fracture.  Orthopedics was consulted.  Assessment & Plan:   Acute right subcapital femoral neck fracture after a mechanical fall - Fall precautions.  Pain management.  NPO.  Orthopedics planning for possible surgical intervention today.  Leukocytosis - Reactive.  Monitor intermittently  Acute metabolic acidosis - Improved  Hyperlipidemia - Continue statin   DVT prophylaxis: SCDs Code Status: Full Family Communication: Sister at bedside Disposition Plan: Status is: Inpatient Remains inpatient appropriate because: Of severity of illness    Consultants: Orthopedics  Procedures: None  Antimicrobials: None   Subjective: Patient seen and examined at bedside.  Complains of right hip pain.  No fever, vomiting, abdominal pain reported.  Objective: Vitals:   02/29/24 1720 02/29/24 2135 03/01/24 0114 03/01/24 0552  BP: 131/60 (!) 119/58 (!) 144/57 (!) 115/59  Pulse: 62 61 62 61  Resp: 15 18 18 18   Temp: 98 F (36.7 C) 97.6 F (36.4 C) 98.6 F (37 C) 98.5 F (36.9 C)  TempSrc: Oral Oral Oral Oral  SpO2: 93% 92% 91% 93%  Weight:      Height:        Intake/Output Summary (Last 24 hours) at 03/01/2024 0716 Last data filed at 03/01/2024 0315 Gross per 24 hour  Intake 120 ml  Output 280 ml  Net -160 ml   Filed Weights   02/29/24 1031  Weight: 72.6 kg    Examination:  General exam: Appears calm and comfortable  Respiratory system: Bilateral decreased breath sounds at bases Cardiovascular system: S1 & S2 heard, Rate controlled Gastrointestinal system: Abdomen is nondistended, soft and nontender. Normal bowel sounds heard. Extremities: No cyanosis, clubbing, edema    Data Reviewed: I have  personally reviewed following labs and imaging studies  CBC: Recent Labs  Lab 02/29/24 1119 03/01/24 0331  WBC 15.6* 12.3*  NEUTROABS 13.4*  --   HGB 14.7 13.3  HCT 43.4 41.5  MCV 93.5 98.1  PLT 246 170   Basic Metabolic Panel: Recent Labs  Lab 02/29/24 1119 03/01/24 0331  NA 139 137  K 4.0 4.0  CL 105 105  CO2 21* 22  GLUCOSE 111* 123*  BUN 18 28*  CREATININE 1.12 0.99  CALCIUM 9.2 8.6*   GFR: Estimated Creatinine Clearance: 53.7 mL/min (by C-G formula based on SCr of 0.99 mg/dL). Liver Function Tests: No results for input(s): "AST", "ALT", "ALKPHOS", "BILITOT", "PROT", "ALBUMIN" in the last 168 hours. No results for input(s): "LIPASE", "AMYLASE" in the last 168 hours. No results for input(s): "AMMONIA" in the last 168 hours. Coagulation Profile: Recent Labs  Lab 02/29/24 1119  INR 1.0   Cardiac Enzymes: No results for input(s): "CKTOTAL", "CKMB", "CKMBINDEX", "TROPONINI" in the last 168 hours. BNP (last 3 results) No results for input(s): "PROBNP" in the last 8760 hours. HbA1C: No results for input(s): "HGBA1C" in the last 72 hours. CBG: No results for input(s): "GLUCAP" in the last 168 hours. Lipid Profile: No results for input(s): "CHOL", "HDL", "LDLCALC", "TRIG", "CHOLHDL", "LDLDIRECT" in the last 72 hours. Thyroid Function Tests: No results for input(s): "TSH", "T4TOTAL", "FREET4", "T3FREE", "THYROIDAB" in the last 72 hours. Anemia Panel: No results for input(s): "VITAMINB12", "FOLATE", "FERRITIN", "TIBC", "IRON", "RETICCTPCT" in the last 72 hours. Sepsis Labs: No results for  input(s): "PROCALCITON", "LATICACIDVEN" in the last 168 hours.  Recent Results (from the past 240 hours)  Surgical PCR screen     Status: None   Collection Time: 02/29/24 10:10 PM   Specimen: Nasal Mucosa; Nasal Swab  Result Value Ref Range Status   MRSA, PCR NEGATIVE NEGATIVE Final   Staphylococcus aureus NEGATIVE NEGATIVE Final    Comment: (NOTE) The Xpert SA Assay (FDA  approved for NASAL specimens in patients 19 years of age and older), is one component of a comprehensive surveillance program. It is not intended to diagnose infection nor to guide or monitor treatment. Performed at [facility], [address], [place], [place] [address]          Radiology Studies: DG Femur Min 2 Views Right Result Date: 02/29/2024 CLINICAL DATA:  Fall, acute right hip pain EXAM: RIGHT FEMUR 2 VIEWS COMPARISON:  02/29/2024 FINDINGS: Bones are osteopenic. There is an acute angulated and minimally displaced right hip subcapital femoral neck fracture. No associated hip joint malalignment, subluxation or dislocation. Included pelvis intact. No diastasis. SI joints maintained. Peripheral vascular calcifications noted. IMPRESSION: Acute right hip subcapital femoral neck fracture. Electronically Signed   By: Melven Stable.  Shick M.D.   On: 02/29/2024 13:04   DG Pelvis 1-2 Views Result Date: 02/29/2024 CLINICAL DATA:  Recent fall, acute right hip pain EXAM: PELVIS - 1-2 VIEW COMPARISON:  02/29/2024 FINDINGS: Bones are osteopenic. There is an acute minimally displaced and angulated fracture of the right hip subcapital femoral neck. Bony pelvis and left hip appear intact. No diastasis. SI joints are maintained. Soft tissues unremarkable. IMPRESSION: Acute right hip subcapital femoral neck fracture. Electronically Signed   By: Melven Stable.  Shick M.D.   On: 02/29/2024 13:03   DG Lumbar Spine Complete Result Date: 02/29/2024 CLINICAL DATA:  Recent fall earlier this morning, back and hip pain EXAM: LUMBAR SPINE - COMPLETE 4+ VIEW COMPARISON:  06/13/2017 thoracic spine MRI FINDINGS: Bones are osteopenic. 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 Mild posterior facet arthropathy spanning L3-S1. Preserved lumbar vertebral body heights. No acute compression fracture, wedge-shaped deformity or  focal kyphosis. No pars defects. Chronic T12 compression deformity noted. SI joints are maintained. IMPRESSION: 1. Osteopenia and degenerative changes as above. 2. No acute finding by plain radiography. Electronically Signed   By: Melven Stable.  Shick M.D.   On: 02/29/2024 13:01        Scheduled Meds:  acetaminophen   1,000 mg Oral Once   rosuvastatin  10 mg Oral QHS   Continuous Infusions:        Audria Leather, MD [facility] 03/01/2024, 7:16 AM

## 2024-03-01 NOTE — Discharge Instructions (Addendum)
 Dr. Adonica Hoose Joint Replacement Specialist [facility] [address] [place], [place] [address] [phone]   TOTAL HIP REPLACEMENT POSTOPERATIVE DIRECTIONS    Hip Rehabilitation, Guidelines Following Surgery   WEIGHT BEARING Weight bearing as tolerated with assist device (walker, cane, etc) as directed, use it as long as suggested by your surgeon or therapist, typically at least 4-6 weeks.  The results of a hip operation are greatly improved after range of motion and muscle strengthening exercises. Follow all safety measures which are given to protect your hip. If any of these exercises cause increased pain or swelling in your joint, decrease the amount until you are comfortable again. Then slowly increase the exercises. Call your caregiver if you have problems or questions.   HOME CARE INSTRUCTIONS  Most of the following instructions are designed to prevent the dislocation of your new hip.  Remove items at home which could result in a fall. This includes throw rugs or furniture in walking pathways.  Continue medications as instructed at time of discharge. You may have some home medications which will be placed on hold until you complete the course of blood thinner medication. You may start showering once you are discharged home. Do not remove your dressing. Do not put on socks or shoes without following the instructions of your caregivers.   Sit on chairs with arms. Use the chair arms to help push yourself up when arising.  Arrange for the use of a toilet seat elevator so you are not sitting low.  Walk with walker as instructed.  You may resume a sexual relationship in one month or when given the OK by your caregiver.  Use walker as long as suggested by your caregivers.  You may put full weight on your legs and walk as much as is comfortable. Avoid periods of inactivity such as sitting longer than an hour when not asleep. This helps prevent blood  clots.  You may return to work once you are cleared by Designer, industrial/product.  Do not drive a car for 6 weeks or until released by your surgeon.  Do not drive while taking narcotics.  Wear elastic stockings for two weeks following surgery during the day but you may remove then at night.  Make sure you keep all of your appointments after your operation with all of your doctors and caregivers. You should call the office at the above phone number and make an appointment for approximately two weeks after the date of your surgery. Please pick up a stool softener and laxative for home use as long as you are requiring pain medications. ICE to the affected hip every three hours for 30 minutes at a time and then as needed for pain and swelling. Continue to use ice on the hip for pain and swelling from surgery. You may notice swelling that will progress down to the foot and ankle.  This is normal after surgery.  Elevate the leg when you are not up walking on it.   It is important for you to complete the blood thinner medication as prescribed by your doctor. Continue to use the breathing machine which will help keep your temperature down.  It is common for your temperature to cycle up and down following surgery, especially at night when you are not up moving around and exerting yourself.  The breathing machine keeps your lungs expanded and your temperature down.  RANGE OF MOTION AND STRENGTHENING EXERCISES  These exercises are designed to help you  keep full movement of your hip joint. Follow your caregiver's or physical therapist's instructions. Perform all exercises about fifteen times, three times per day or as directed. Exercise both hips, even if you have had only one joint replacement. These exercises can be done on a training (exercise) mat, on the floor, on a table or on a bed. Use whatever works the best and is most comfortable for you. Use music or television while you are exercising so that the exercises are a  pleasant break in your day. This will make your life better with the exercises acting as a break in routine you can look forward to.  Lying on your back, slowly slide your foot toward your buttocks, raising your knee up off the floor. Then slowly slide your foot back down until your leg is straight again.  Lying on your back spread your legs as far apart as you can without causing discomfort.  Lying on your side, raise your upper leg and foot straight up from the floor as far as is comfortable. Slowly lower the leg and repeat.  Lying on your back, tighten up the muscle in the front of your thigh (quadriceps muscles). You can do this by keeping your leg straight and trying to raise your heel off the floor. This helps strengthen the largest muscle supporting your knee.  Lying on your back, tighten up the muscles of your buttocks both with the legs straight and with the knee bent at a comfortable angle while keeping your heel on the floor.   SKILLED REHAB INSTRUCTIONS: If the patient is transferred to a skilled rehab facility following release from the hospital, a list of the current medications will be sent to the facility for the patient to continue.  When discharged from the skilled rehab facility, please have the facility set up the patient's Home Health Physical Therapy prior to being released. Also, the skilled facility will be responsible for providing the patient with their medications at time of release from the facility to include their pain medication and their blood thinner medication. If the patient is still at the rehab facility at time of the two week follow up appointment, the skilled rehab facility will also need to assist the patient in arranging follow up appointment in our office and any transportation needs.  POST-OPERATIVE OPIOID TAPER INSTRUCTIONS: It is important to wean off of your opioid medication as soon as possible. If you do not need pain medication after your surgery it is ok  to stop day one. Opioids include: Codeine, Hydrocodone (Norco, Vicodin), Oxycodone(Percocet, oxycontin) and hydromorphone  amongst others.  Long term and even short term use of opiods can cause: Increased pain response Dependence Constipation Depression Respiratory depression And more.  Withdrawal symptoms can include Flu like symptoms Nausea, vomiting And more Techniques to manage these symptoms Hydrate well Eat regular healthy meals Stay active Use relaxation techniques(deep breathing, meditating, yoga) Do Not substitute Alcohol  to help with tapering If you have been on opioids for less than two weeks and do not have pain than it is ok to stop all together.  Plan to wean off of opioids This plan should start within one week post op of your joint replacement. Maintain the same interval or time between taking each dose and first decrease the dose.  Cut the total daily intake of opioids by one tablet each day Next start to increase the time between doses. The last dose that should be eliminated is the evening dose.    MAKE  SURE YOU:  Understand these instructions.  Will watch your condition.  Will get help right away if you are not doing well or get worse.  Pick up stool softner and laxative for home use following surgery while on pain medications. Do not remove your dressing. The dressing is waterproof--it is OK to take showers. Continue to use ice for pain and swelling after surgery. Do not use any lotions or creams on the incision until instructed by your surgeon. He may weight-bear as tolerated with a walker in a hip abduction brace.  He is to wear the brace at all times except for daily skin checks and hygiene for 6 weeks.   Posterior hip precautions.

## 2024-03-01 NOTE — Anesthesia Procedure Notes (Signed)
 Spinal  Patient location during procedure: OR Start time: 03/01/2024 3:15 PM End time: 03/01/2024 3:18 PM Reason for block: surgical anesthesia Staffing Performed: anesthesiologist  Anesthesiologist: Vernadine Golas, MD Performed by: Vernadine Golas, MD Authorized by: Vernadine Golas, MD   Preanesthetic Checklist Completed: patient identified, IV checked, risks and benefits discussed, surgical consent, monitors and equipment checked, pre-op evaluation and timeout performed Spinal Block Patient position: left lateral decubitus Prep: DuraPrep and site prepped and draped Patient monitoring: continuous pulse ox, blood pressure and heart rate Approach: midline Location: L3-4 Injection technique: single-shot Needle Needle type: Pencan  Needle gauge: 24 G Needle length: 9 cm Assessment Events: CSF return Additional Notes Risks, benefits, and alternative discussed. Patient gave consent to procedure. Prepped and draped in left lateral position. Clear CSF obtained after one needle redirection. Positive terminal aspiration. No pain or paraesthesias with injection. Patient tolerated procedure well. Vital signs stable. [name], MD

## 2024-03-02 ENCOUNTER — Encounter (HOSPITAL_COMMUNITY): Payer: Self-pay | Admitting: Orthopedic Surgery

## 2024-03-02 ENCOUNTER — Telehealth: Payer: Self-pay | Admitting: Family Medicine

## 2024-03-02 DIAGNOSIS — S72011A Unspecified intracapsular fracture of right femur, initial encounter for closed fracture: Secondary | ICD-10-CM | POA: Diagnosis not present

## 2024-03-02 MED ORDER — ASPIRIN 81 MG PO CHEW: 81.0000 mg | CHEWABLE_TABLET | Freq: Two times a day (BID) | ORAL | 0 refills | Status: AC

## 2024-03-02 MED ORDER — HYDROCODONE-ACETAMINOPHEN 5-325 MG PO TABS: 1.0000 | ORAL_TABLET | ORAL | 0 refills | Status: AC | PRN

## 2024-03-02 NOTE — Plan of Care (Signed)
   Problem: Education: Goal: Knowledge of General Education information will improve Description Including pain rating scale, medication(s)/side effects and non-pharmacologic comfort measures Outcome: Progressing   Problem: Activity: Goal: Risk for activity intolerance will decrease Outcome: Progressing   Problem: Safety: Goal: Ability to remain free from injury will improve Outcome: Progressing

## 2024-03-02 NOTE — Progress Notes (Signed)
    Subjective:  Patient reports pain as mild.  Denies N/V/CP/SOB/Abd pain. He denies any tingling or numbness in LE bilaterally.  He reports pain well controlled.  Daughter at bedside.   Objective:   VITALS:   Vitals:   03/01/24 2021 03/02/24 0018 03/02/24 0354 03/02/24 1002  BP: (!) 136/54 (!) 143/61 (!) 145/60 (!) 144/55  Pulse: (!) 53 (!) 50 (!) 42 (!) 50  Resp: 18 16 18 18   Temp: 97.7 F (36.5 C) 97.8 F (36.6 C) 97.9 F (36.6 C) 97.6 F (36.4 C)  TempSrc: Oral Oral Oral   SpO2: 96% 98% 99% 96%  Weight:      Height:        NAD Neurologically intact ABD soft Neurovascular intact Sensation intact distally Intact pulses distally Dorsiflexion/Plantar flexion intact Incision: dressing C/D/I No cellulitis present Compartment soft   Lab Results  Component Value Date   WBC 12.3 (H) 03/01/2024   HGB 13.3 03/01/2024   HCT 41.5 03/01/2024   MCV 98.1 03/01/2024   PLT 170 03/01/2024   BMET    Component Value Date/Time   NA 137 03/01/2024 0331   K 4.0 03/01/2024 0331   CL 105 03/01/2024 0331   CO2 22 03/01/2024 0331   GLUCOSE 123 (H) 03/01/2024 0331   BUN 28 (H) 03/01/2024 0331   CREATININE 0.99 03/01/2024 0331   CALCIUM 8.6 (L) 03/01/2024 0331   GFRNONAA >60 03/01/2024 0331     Assessment/Plan: 1 Day Post-Op   Principal Problem:   Closed subcapital fracture of femur (HCC)   WBAT with walker DVT ppx: Aspirin, SCDs, TEDS PO pain control PT/OT: Has not seen yet. To come by today.  Dispo:  - Patient under care of the medical team, disposition per their recommendation. Patient hopeful for d/c home. Pain medications printed in chart. Aspirin 81mg  BID x6 weeks for DVT ppx.    Harman Lightning 03/02/2024, 10:06 AM    [facility]  [place] [address], [address], [place], [place] [address] Phone: [phone] [email or web address] Facebook  [employer]

## 2024-03-02 NOTE — Progress Notes (Signed)
 PROGRESS NOTE    Dennis Osborn  ZOX:[id number] DOB: 1940/11/11 DOA: 02/29/2024 PCP: Abram Abraham, NP-C   Brief Narrative:  83 y.o. male with medical history significant of hyperlipidemia, BPH presented with a fall and was found to have acute right subcapital femoral neck fracture.  Orthopedics was consulted.  He underwent surgical intervention by orthopedics on 03/01/2024.  Assessment & Plan:   Acute right subcapital femoral neck fracture after a mechanical fall - Fall precautions.  Pain management.   - Status post surgical intervention by orthopedics on 03/01/2024.  Wound care/activity/DVT prophylaxis/pain management as per orthopedics recommendations. -PT/OT eval.  Leukocytosis - Reactive.  Monitor intermittently.  No labs today.  Acute metabolic acidosis - Improved  Hyperlipidemia - Continue statin   DVT prophylaxis: Aspirin as per orthopedics  code Status: Full Family Communication: Sister at bedside on 03/01/2024 Disposition Plan: Status is: Inpatient Remains inpatient appropriate because: Of severity of illness    Consultants: Orthopedics  Procedures: As above  Antimicrobials: Perioperative   Subjective: Patient seen and examined at bedside.  Complains of intermittent right hip pain.  No abdominal pain, fever or vomiting reported..  Objective: Vitals:   03/01/24 1831 03/01/24 2021 03/02/24 0018 03/02/24 0354  BP: (!) 164/63 (!) 136/54 (!) 143/61 (!) 145/60  Pulse: (!) 49 (!) 53 (!) 50 (!) 42  Resp: 14 18 16 18   Temp: 97.7 F (36.5 C) 97.7 F (36.5 C) 97.8 F (36.6 C) 97.9 F (36.6 C)  TempSrc: Oral Oral Oral Oral  SpO2: 100% 96% 98% 99%  Weight:      Height:        Intake/Output Summary (Last 24 hours) at 03/02/2024 0735 Last data filed at 03/02/2024 0700 Gross per 24 hour  Intake 3027.36 ml  Output 1225 ml  Net 1802.36 ml   Filed Weights   02/29/24 1031 03/01/24 1313  Weight: 72.6 kg 72.6 kg    Examination:  General: On room air.  No  distress.  respiratory: Decreased breath sounds at bases bilaterally with some crackles CVS: Mild intermittent bradycardia present; S1-S2 heard  abdominal: Soft, nontender, slightly distended, no organomegaly;  bowel sounds are heard  extremities: Trace lower extremity edema; no clubbing.      Data Reviewed: I have personally reviewed following labs and imaging studies  CBC: Recent Labs  Lab 02/29/24 1119 03/01/24 0331  WBC 15.6* 12.3*  NEUTROABS 13.4*  --   HGB 14.7 13.3  HCT 43.4 41.5  MCV 93.5 98.1  PLT 246 170   Basic Metabolic Panel: Recent Labs  Lab 02/29/24 1119 03/01/24 0331  NA 139 137  K 4.0 4.0  CL 105 105  CO2 21* 22  GLUCOSE 111* 123*  BUN 18 28*  CREATININE 1.12 0.99  CALCIUM 9.2 8.6*   GFR: Estimated Creatinine Clearance: 53.7 mL/min (by C-G formula based on SCr of 0.99 mg/dL). Liver Function Tests: No results for input(s): "AST", "ALT", "ALKPHOS", "BILITOT", "PROT", "ALBUMIN" in the last 168 hours. No results for input(s): "LIPASE", "AMYLASE" in the last 168 hours. No results for input(s): "AMMONIA" in the last 168 hours. Coagulation Profile: Recent Labs  Lab 02/29/24 1119  INR 1.0   Cardiac Enzymes: No results for input(s): "CKTOTAL", "CKMB", "CKMBINDEX", "TROPONINI" in the last 168 hours. BNP (last 3 results) No results for input(s): "PROBNP" in the last 8760 hours. HbA1C: No results for input(s): "HGBA1C" in the last 72 hours. CBG: No results for input(s): "GLUCAP" in the last 168 hours. Lipid Profile: No results for  input(s): "CHOL", "HDL", "LDLCALC", "TRIG", "CHOLHDL", "LDLDIRECT" in the last 72 hours. Thyroid Function Tests: No results for input(s): "TSH", "T4TOTAL", "FREET4", "T3FREE", "THYROIDAB" in the last 72 hours. Anemia Panel: No results for input(s): "VITAMINB12", "FOLATE", "FERRITIN", "TIBC", "IRON", "RETICCTPCT" in the last 72 hours. Sepsis Labs: No results for input(s): "PROCALCITON", "LATICACIDVEN" in the last 168  hours.  Recent Results (from the past 240 hours)  Surgical PCR screen     Status: None   Collection Time: 02/29/24 10:10 PM   Specimen: Nasal Mucosa; Nasal Swab  Result Value Ref Range Status   MRSA, PCR NEGATIVE NEGATIVE Final   Staphylococcus aureus NEGATIVE NEGATIVE Final    Comment: (NOTE) The Xpert SA Assay (FDA approved for NASAL specimens in patients 61 years of age and older), is one component of a comprehensive surveillance program. It is not intended to diagnose infection nor to guide or monitor treatment. Performed at [facility], [address] [place], [place] [address]          Radiology Studies: DG Pelvis Portable Result Date: 03/01/2024 CLINICAL DATA:  [id number] Closed subcapital fracture of neck of right femur, initial encounter [facility]) [id number] EXAM: PORTABLE PELVIS 1-2 VIEWS COMPARISON:  Feb 29, 2024 FINDINGS: There is no evidence of pelvic fracture or diastasis. No pelvic bone lesions are seen. Interval placement of a right hip arthroplasty, which is anatomically aligned without dislocation. No acute bone fracture of the femur. 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 Soft tissue edema and extensive subcutaneous emphysema along the right hip, expected findings at this stage in the postoperative recovery. IMPRESSION: Well-aligned right hip arthroplasty without dislocation or acute bone fracture. No radiopaque foreign body or retained surgical instrument. Electronically Signed   By: Rance Burrows M.D.   On: 03/01/2024 18:25   DG HIP UNILAT WITH PELVIS 1V RIGHT Result Date: 03/01/2024 CLINICAL DATA:  Right total hip arthroplasty. EXAM: DG HIP (WITH OR WITHOUT PELVIS) 1V RIGHT COMPARISON:  Radiographs 02/29/2024. FINDINGS: C-arm fluoroscopy was provided in the operating room without the presence of a radiologist.8 seconds fluoroscopy time. 1.0393 mGy air kerma. Eight C-arm fluoroscopic images were obtained intraoperatively and  are submitted for post operative interpretation. Images demonstrate the performance of a right total hip arthroplasty. On the final images, the hardware appears well positioned. No complications are identified. Please see intraoperative findings for further detail. IMPRESSION: Intraoperative fluoroscopic guidance for right total hip arthroplasty. Electronically Signed   By: Elmon Hagedorn M.D.   On: 03/01/2024 16:57   DG C-Arm 1-60 Min-No Report Result Date: 03/01/2024 Fluoroscopy was utilized by the requesting physician.  No radiographic interpretation.   DG C-Arm 1-60 Min-No Report Result Date: 03/01/2024 Fluoroscopy was utilized by the requesting physician.  No radiographic interpretation.   DG Femur Min 2 Views Right Result Date: 02/29/2024 CLINICAL DATA:  Fall, acute right hip pain EXAM: RIGHT FEMUR 2 VIEWS COMPARISON:  02/29/2024 FINDINGS: Bones are osteopenic. There is an acute angulated and minimally displaced right hip subcapital femoral neck fracture. No associated hip joint malalignment, subluxation or dislocation. Included pelvis intact. No diastasis. SI joints maintained. Peripheral vascular calcifications noted. IMPRESSION: Acute right hip subcapital femoral neck fracture. Electronically Signed   By: Melven Stable.  Shick M.D.   On: 02/29/2024 13:04   DG Pelvis 1-2 Views Result Date: 02/29/2024 CLINICAL DATA:  Recent fall, acute right hip pain EXAM: PELVIS - 1-2 VIEW COMPARISON:  02/29/2024 FINDINGS: Bones are osteopenic. There is an acute minimally displaced and angulated fracture of  the right hip subcapital femoral neck. Bony pelvis and left hip appear intact. No diastasis. SI joints are maintained. Soft tissues unremarkable. IMPRESSION: Acute right hip subcapital femoral neck fracture. Electronically Signed   By: Melven Stable.  Shick M.D.   On: 02/29/2024 13:03   DG Lumbar Spine Complete Result Date: 02/29/2024 CLINICAL DATA:  Recent fall earlier this morning, back and hip pain EXAM: LUMBAR SPINE -  COMPLETE 4+ VIEW COMPARISON:  06/13/2017 thoracic spine MRI FINDINGS: Bones are osteopenic. 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 Mild posterior facet arthropathy spanning L3-S1. Preserved lumbar vertebral body heights. No acute compression fracture, wedge-shaped deformity or focal kyphosis. No pars defects. Chronic T12 compression deformity noted. SI joints are maintained. IMPRESSION: 1. Osteopenia and degenerative changes as above. 2. No acute finding by plain radiography. Electronically Signed   By: Melven Stable.  Shick M.D.   On: 02/29/2024 13:01        Scheduled Meds:  aspirin  81 mg Oral BID   docusate sodium  100 mg Oral BID   pantoprazole  40 mg Oral Daily   rosuvastatin  10 mg Oral QHS   senna  1 tablet Oral BID   Continuous Infusions:  sodium chloride  75 mL/hr at 03/01/24 2040   lactated ringers  40 mL/hr at 03/01/24 1255          Sahvanna Mcmanigal, MD [facility] 03/02/2024, 7:35 AM

## 2024-03-02 NOTE — Progress Notes (Signed)
 Physical Therapy Treatment Patient Details Name: Dennis Osborn MRN: [id number] DOB: 01-Jun-1941 Today's Date: 03/02/2024   History of Present Illness 83 y.o. male admitted with R hip fx, s/p THA-AA. Past medical history significant of hyperlipidemia, BPH.    PT Comments  Pt is progressing well with mobility, he ambulated 160' with RW, no loss of balance. Instructed pt in THA HEP, handout issued, he demonstrated good understanding. Will plan to do stair training tomorrow, then I expect he'll be ready to DC from a PT standpoint.     If plan is discharge home, recommend the following: A little help with bathing/dressing/bathroom;Assistance with cooking/housework;Assist for transportation;Help with stairs or ramp for entrance   Can travel by private vehicle        Equipment Recommendations  Rolling walker (2 wheels)    Recommendations for Other Services       Precautions / Restrictions Precautions Precautions: Fall Recall of Precautions/Restrictions: Intact Precaution/Restrictions Comments: no other falls in past 6 months Restrictions Weight Bearing Restrictions Per Provider Order: No RLE Weight Bearing Per Provider Order: Weight bearing as tolerated     Mobility  Bed Mobility Overal bed mobility: Modified Independent             General bed mobility comments: used bedrail    Transfers Overall transfer level: Needs assistance Equipment used: Rolling walker (2 wheels) Transfers: Sit to/from Stand Sit to Stand: Contact guard assist           General transfer comment: VCs hand placement    Ambulation/Gait Ambulation/Gait assistance: Contact guard assist Gait Distance (Feet): 160 Feet Assistive device: Rolling walker (2 wheels) Gait Pattern/deviations: Step-through pattern, Decreased stride length Gait velocity: WFL     General Gait Details: steady, no loss of balance   Stairs             Wheelchair Mobility     Tilt Bed    Modified Rankin (Stroke  Patients Only)       Balance Overall balance assessment: Modified Independent                                          Communication Communication Communication: Impaired Factors Affecting Communication: 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)  Cognition Arousal: Alert Behavior During Therapy: WFL for tasks assessed/performed   PT - Cognitive impairments: No apparent impairments                         Following commands: Intact      Cueing    Exercises Total Joint Exercises Ankle Circles/Pumps: AROM, Both, 10 reps, Supine Quad Sets: AROM, Both, 5 reps, Supine Short Arc Quad: AROM, Right, 10 reps, Supine Heel Slides: AAROM, Right, Supine, 10 reps Hip ABduction/ADduction: AAROM, Right, 10 reps, Supine    General Comments        Pertinent Vitals/Pain Pain Assessment Pain Assessment: 0-10 Pain Score: 3  Pain Location: R hip Pain Descriptors / Indicators: Sore Pain Intervention(s): Limited activity within patient's tolerance, Monitored during session, Repositioned, Ice applied    Home Living Family/patient expects to be discharged to:: Private residence Living Arrangements: Alone Available Help at Discharge: Family;Available PRN/intermittently Type of Home: House Home Access: Stairs to enter   [employer] of Steps: 3 with 1 rail and sliding door or 1 step without rail in front   Home Layout: One  level Home Equipment: Shower seat Additional Comments: daughter is available Sat/Sun but has plans to go out ot country Monday; pt's sister may be able to assist    Prior Function            PT Goals (current goals can now be found in the care plan section) Acute Rehab PT Goals Patient Stated Goal: DC home PT Goal Formulation: With patient/family Time For Goal Achievement: 03/09/24 Potential to Achieve Goals: Good Progress towards PT goals: Progressing toward goals    Frequency    7X/week      PT  Plan      Co-evaluation              AM-PAC PT "6 Clicks" Mobility   Outcome Measure  Help needed turning from your back to your side while in a flat bed without using bedrails?: None Help needed moving from lying on your back to sitting on the side of a flat bed without using bedrails?: A Little Help needed moving to and from a bed to a chair (including a wheelchair)?: None Help needed standing up from a chair using your arms (e.g., wheelchair or bedside chair)?: None Help needed to walk in hospital room?: None Help needed climbing 3-5 steps with a railing? : A Little 6 Click Score: 22    End of Session Equipment Utilized During Treatment: Gait belt Activity Tolerance: Patient tolerated treatment well Patient left: in chair;with chair alarm set;with call bell/phone within reach;with family/visitor present Nurse Communication: Mobility status PT Visit Diagnosis: Difficulty in walking, not elsewhere classified (R26.2);Pain;History of falling (Z91.81) Pain - Right/Left: Right Pain - part of body: Hip     Time: 9147-8295 PT Time Calculation (min) (ACUTE ONLY): 22 min  Charges:    $Gait Training: 8-22 mins PT General Charges $$ ACUTE PT VISIT: 1 Visit                     Lynann Sandman Kistler PT 03/02/2024  Acute Rehabilitation Services  Office [phone]

## 2024-03-02 NOTE — Evaluation (Addendum)
 Physical Therapy Evaluation Patient Details Name: Dennis Osborn MRN: [id number] DOB: 1941-08-05 Today's Date: 03/02/2024  History of Present Illness  83 y.o. male admitted with R hip fx, s/p THA-AA. Past medical history significant of hyperlipidemia, BPH.  Clinical Impression  Pt is s/p R THA resulting in the deficits listed below (see PT Problem List). Pt ambulated 160' with RW, no loss of balance. Initiated THA HEP. Daughter present for session. She and pt are hoping pt can DC home. Daughter can assist over the weekend, but then is going out of town. Pt's sister may be able to assist starting Monday. Pt is mobilizing well and should be safe to walk independently at home with a RW. He will mostly need help with bathing/dressing, meal prep, driving.  Pt will benefit from acute skilled PT to increase their independence and safety with mobility to facilitate discharge.          If plan is discharge home, recommend the following: A little help with bathing/dressing/bathroom;Assistance with cooking/housework;Assist for transportation;Help with stairs or ramp for entrance   Can travel by private vehicle        Equipment Recommendations Rolling walker (2 wheels)  Recommendations for Other Services       Functional Status Assessment Patient has had a recent decline in their functional status and demonstrates the ability to make significant improvements in function in a reasonable and predictable amount of time.     Precautions / Restrictions Precautions Precautions: Fall Recall of Precautions/Restrictions: Intact Precaution/Restrictions Comments: no other falls in past 6 months Restrictions Weight Bearing Restrictions Per Provider Order: No RLE Weight Bearing Per Provider Order: Weight bearing as tolerated      Mobility  Bed Mobility Overal bed mobility: Modified Independent             General bed mobility comments: used bedrail    Transfers Overall transfer level: Needs  assistance Equipment used: Rolling walker (2 wheels) Transfers: Sit to/from Stand Sit to Stand: Contact guard assist           General transfer comment: VCs hand placement    Ambulation/Gait Ambulation/Gait assistance: Contact guard assist Gait Distance (Feet): 160 Feet Assistive device: Rolling walker (2 wheels) Gait Pattern/deviations: Step-through pattern, Decreased stride length Gait velocity: WFL     General Gait Details: steady, no loss of balance  Stairs            Wheelchair Mobility     Tilt Bed    Modified Rankin (Stroke Patients Only)       Balance Overall balance assessment: Modified Independent                                           Pertinent Vitals/Pain Pain Assessment Pain Assessment: 0-10 Pain Score: 3  Pain Location: R hip Pain Descriptors / Indicators: Sore Pain Intervention(s): Limited activity within patient's tolerance, Monitored during session, Ice applied, Repositioned    Home Living Family/patient expects to be discharged to:: Private residence Living Arrangements: Alone Available Help at Discharge: Family;Available PRN/intermittently Type of Home: House Home Access: Stairs to enter   [employer] of Steps: 3 with 1 rail and sliding door or 1 step without rail in front   Home Layout: One level Home Equipment: Shower seat Additional Comments: daughter is available Sat/Sun but has plans to go out ot country Monday; pt's sister may be able to assist  Prior Function Prior Level of Function : Independent/Modified Independent;Driving             Mobility Comments: walks without AD, no other falls in past 6 months ADLs Comments: independent     Extremity/Trunk Assessment   Upper Extremity Assessment Upper Extremity Assessment: Overall WFL for tasks assessed    Lower Extremity Assessment Lower Extremity Assessment: RLE deficits/detail RLE Deficits / Details: knee ext at least 3/5, hip  AAROM flexion ~45*, abduction ~20* RLE Sensation: WNL RLE Coordination: WNL    Cervical / Trunk Assessment Cervical / Trunk Assessment: Normal  Communication   Communication Communication: Impaired Factors Affecting Communication: 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)    Cognition Arousal: Alert Behavior During Therapy: WFL for tasks assessed/performed   PT - Cognitive impairments: No apparent impairments                         Following commands: Intact       Cueing       General Comments      Exercises Total Joint Exercises Ankle Circles/Pumps: AROM, Both, 10 reps, Supine Heel Slides: AAROM, Right, 15 reps, Supine Hip ABduction/ADduction: AAROM, Right, 10 reps, Supine   Assessment/Plan    PT Assessment Patient needs continued PT services  PT Problem List Decreased mobility;Decreased activity tolerance;Pain;Decreased knowledge of use of DME       PT Treatment Interventions DME instruction;Gait training;Therapeutic exercise;Stair training;Patient/family education    PT Goals (Current goals can be found in the Care Plan section)  Acute Rehab PT Goals Patient Stated Goal: DC home PT Goal Formulation: With patient/family Time For Goal Achievement: 03/09/24 Potential to Achieve Goals: Good    Frequency 7X/week     Co-evaluation               AM-PAC PT "6 Clicks" Mobility  Outcome Measure Help needed turning from your back to your side while in a flat bed without using bedrails?: None Help needed moving from lying on your back to sitting on the side of a flat bed without using bedrails?: A Little Help needed moving to and from a bed to a chair (including a wheelchair)?: None Help needed standing up from a chair using your arms (e.g., wheelchair or bedside chair)?: None Help needed to walk in hospital room?: None Help needed climbing 3-5 steps with a railing? : A Little 6 Click Score: 22    End of Session Equipment  Utilized During Treatment: Gait belt Activity Tolerance: Patient tolerated treatment well Patient left: in chair;with chair alarm set;with call bell/phone within reach;with family/visitor present Nurse Communication: Mobility status PT Visit Diagnosis: Difficulty in walking, not elsewhere classified (R26.2);Pain;History of falling (Z91.81) Pain - Right/Left: Right Pain - part of body: Hip    Time: 1914-7829 PT Time Calculation (min) (ACUTE ONLY): 31 min   Charges:   PT Evaluation $PT Eval Moderate Complexity: 1 Mod PT Treatments $Gait Training: 8-22 mins PT General Charges $$ ACUTE PT VISIT: 1 Visit         Daymon Evans PT 03/02/2024  Acute Rehabilitation Services  Office [phone]

## 2024-03-02 NOTE — Telephone Encounter (Signed)
 Copied from CRM [id number]. Topic: General - Other >> Mar 02, 2024  1:17 PM Caliyah H wrote: Reason for CRM: Patient called to update his preferred pharmacy. He requested that all future prescriptions written by Alyson Back be sent to his home delivery pharmacy.  [facility] - [place], [place] - [address] [place] [address] Phone: [phone] Fax: [phone] Hours: Not open 24 hours

## 2024-03-02 NOTE — [place] FL2 (Signed)
 [place]  MEDICAID FL2 LEVEL OF CARE FORM     IDENTIFICATION  Patient Name: Dennis Osborn Birthdate: 1941/10/11 Sex: male Admission Date (Current Location): 02/29/2024  [facility] and [place] Number:  Producer, television/film/video and Address:  [facility],  [address] 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    Orientation RESPIRATION BLADDER Height & Weight     Self, Time, Situation, Place  Normal Continent Weight: 160 lb 0.9 oz (72.6 kg) Height:  5\' 8"  (172.7 cm)  BEHAVIORAL SYMPTOMS/MOOD NEUROLOGICAL BOWEL NUTRITION STATUS      Continent Diet (regular)  AMBULATORY STATUS COMMUNICATION OF NEEDS Skin   Limited Assist Verbally Other (Comment) (surgical incision only)                       Personal Care Assistance Level of Assistance  Bathing, Dressing Bathing Assistance: Limited assistance   Dressing Assistance: Limited assistance     Functional Limitations Info  Sight, Hearing, Speech Sight Info: Adequate Hearing Info: Adequate Speech Info: Adequate    SPECIAL CARE FACTORS FREQUENCY  PT (By licensed PT), OT (By licensed OT)     PT Frequency: 5x/wk OT Frequency: 5x/wk            Contractures Contractures Info: Not present    Additional Factors Info  Code Status, Allergies Code Status Info: Full Allergies Info: NKDA           Current Medications (03/02/2024):  This is the current hospital active medication list Current Facility-Administered  Medications  Medication Dose Route Frequency Provider Last Rate Last Admin   acetaminophen  (TYLENOL ) tablet 325-650 mg  325-650 mg Oral Q6H PRN Hill, Avery S, PA-C       alum & mag hydroxide-simeth (MAALOX/MYLANTA) 200-200-20 MG/5ML suspension 30 mL  30 mL Oral Q4H PRN Hill, Avery S, PA-C       aspirin chewable tablet 81 mg  81 mg Oral BID Harman Lightning, PA-C   81 mg at 03/02/24 0923   bisacodyl (DULCOLAX) suppository 10 mg  10 mg Rectal Daily PRN Hill, Avery S, PA-C       diphenhydrAMINE (BENADRYL) 12.5 MG/5ML elixir 12.5-25 mg  12.5-25 mg Oral Q4H PRN Hill, Avery S, PA-C       docusate sodium (COLACE) capsule 100 mg  100 mg Oral BID Trixie Furnace S, PA-C   100 mg at 03/02/24 8469   HYDROcodone -acetaminophen  (NORCO) 7.5-325 MG per tablet 1-2 tablet  1-2 tablet Oral Q4H PRN Hill, Avery S, PA-C       HYDROcodone -acetaminophen  (NORCO/VICODIN) 5-325 MG per tablet 1-2 tablet  1-2 tablet Oral Q4H PRN Hill, Avery S, PA-C       menthol-cetylpyridinium (CEPACOL) lozenge 3 mg  1 lozenge Oral PRN Trixie Furnace S, PA-C       Or   phenol (CHLORASEPTIC) mouth spray 1 spray  1 spray Mouth/Throat PRN Hill, Philippa Bray, PA-C       methocarbamol (ROBAXIN) tablet 500 mg  500 mg Oral Q6H PRN Harman Lightning, PA-C       Or   methocarbamol (ROBAXIN) injection 500 mg  500 mg Intravenous Q6H PRN Hill, Avery S, PA-C       metoCLOPramide (REGLAN) tablet 5-10 mg  5-10 mg Oral Q8H PRN Hill, Avery S, PA-C       Or   metoCLOPramide (REGLAN) injection 5-10 mg  5-10 mg Intravenous Q8H PRN Hill, Avery S, PA-C       morphine (PF) 2 MG/ML injection 0.5-1 mg  0.5-1 mg Intravenous Q2H PRN Hill, Avery S, PA-C       ondansetron  (ZOFRAN ) tablet 4 mg  4 mg Oral Q6H PRN Hill, Avery S, PA-C       Or   ondansetron  (ZOFRAN ) injection 4 mg  4 mg Intravenous Q6H PRN Harman Lightning, PA-C       Oral care mouth rinse  15 mL Mouth Rinse PRN Hill, Avery S, PA-C       pantoprazole (PROTONIX) EC tablet 40 mg  40 mg Oral Daily Trixie Furnace S, PA-C   40 mg  at 03/02/24 6962   polyethylene glycol (MIRALAX / GLYCOLAX) packet 17 g  17 g Oral Daily PRN Trixie Furnace S, PA-C       rosuvastatin (CRESTOR) tablet 10 mg  10 mg Oral QHS Hill, Avery S, PA-C   10 mg at 03/01/24 2043   senna (SENOKOT) tablet 8.6 mg  1 tablet Oral BID Trixie Furnace S, PA-C   8.6 mg at 03/02/24 9528     Discharge Medications: Please see discharge summary for a list of discharge medications.  Relevant Imaging Results:  Relevant Lab Results:   Additional Information SS#[id number]  Delilah Fend, LCSW

## 2024-03-02 NOTE — Telephone Encounter (Signed)
 noted

## 2024-03-02 NOTE — TOC Progression Note (Signed)
 Transition of Care [facility]) - Progression Note    Patient Details  Name: Dennis Osborn MRN: [id number] Date of Birth: [birth year]  [facility]) CM/SW Contact  Delilah Fend, LCSW Phone Number: 03/02/2024, 3:28 PM  Clinical Narrative:    Met with pt and daughter this afternoon to review therapy recommendations of HHPT and RW.  Pt agreeable, however, daughter concerned about family being able to meet support needs at dc.  Notes she is going to be out of the country and pt's sister would be only other support.  She is aware that pt is making good progress with PT and may not meet criteria for SNF but asking that CSW reach out to area facilities to check bed availability.  Have initiated bed search with plan that we will see how patient does with PT session tomorrow and if they feel more comfortable with home dc.  If not, can review any bed offers.     Expected Discharge Plan: Skilled Nursing Facility Barriers to Discharge: Continued Medical Work up  Expected Discharge Plan and Services In-house Referral: Clinical Social Work     Living arrangements for the past 2 months: Single Family Home                                       Social Determinants of Health (SDOH) Interventions SDOH Screenings   Food Insecurity: No Food Insecurity (02/29/2024)  Housing: Low Risk  (02/29/2024)  Transportation Needs: No Transportation Needs (02/29/2024)  Utilities: Not At Risk (02/29/2024)  Depression (PHQ2-9): Low Risk  (01/18/2024)  Social Connections: Unknown (02/27/2022)   Received from [facility]  Tobacco Use: Low Risk  (02/29/2024)    Readmission Risk Interventions    03/01/2024   12:26 PM  Readmission Risk Prevention Plan  Post Dischage Appt Complete  Medication Screening Complete  Transportation Screening Complete

## 2024-03-03 ENCOUNTER — Inpatient Hospital Stay (HOSPITAL_COMMUNITY)

## 2024-03-03 ENCOUNTER — Inpatient Hospital Stay (HOSPITAL_COMMUNITY): Admitting: Certified Registered Nurse Anesthetist

## 2024-03-03 ENCOUNTER — Encounter (HOSPITAL_COMMUNITY)
Admission: EM | Disposition: A | Discharge status: Skilled Nursing Facility | Payer: Self-pay | Source: Home / Self Care | Attending: Internal Medicine

## 2024-03-03 ENCOUNTER — Encounter (HOSPITAL_COMMUNITY): Payer: Self-pay | Admitting: Internal Medicine

## 2024-03-03 ENCOUNTER — Ambulatory Visit: Payer: Self-pay | Admitting: Student

## 2024-03-03 DIAGNOSIS — S72011A Unspecified intracapsular fracture of right femur, initial encounter for closed fracture: Secondary | ICD-10-CM | POA: Diagnosis not present

## 2024-03-03 DIAGNOSIS — T84020A Dislocation of internal right hip prosthesis, initial encounter: Secondary | ICD-10-CM | POA: Diagnosis not present

## 2024-03-03 HISTORY — PX: HIP CLOSED REDUCTION: SHX983

## 2024-03-03 SURGERY — CLOSED MANIPULATION, JOINT, HIP
Anesthesia: General | Site: Hip | Laterality: Right

## 2024-03-03 MED ORDER — FENTANYL CITRATE (PF) 100 MCG/2ML IJ SOLN
INTRAMUSCULAR | Status: AC
  Filled 2024-03-03: qty 2

## 2024-03-03 MED ORDER — PROPOFOL 10 MG/ML IV BOLUS
INTRAVENOUS | Status: DC | PRN
  Administered 2024-03-03: 200 mg via INTRAVENOUS

## 2024-03-03 MED ORDER — ONDANSETRON HCL 4 MG/2ML IJ SOLN
INTRAMUSCULAR | Status: DC | PRN
  Administered 2024-03-03: 4 mg via INTRAVENOUS

## 2024-03-03 MED ORDER — LIDOCAINE HCL (PF) 2 % IJ SOLN
INTRAMUSCULAR | Status: DC | PRN
  Administered 2024-03-03: 60 mg via INTRADERMAL

## 2024-03-03 MED ORDER — ONDANSETRON HCL 4 MG/2ML IJ SOLN: 4.0000 mg | Freq: Once | INTRAMUSCULAR | Status: DC | PRN

## 2024-03-03 MED ORDER — PROPOFOL 10 MG/ML IV BOLUS
INTRAVENOUS | Status: AC
  Filled 2024-03-03: qty 20

## 2024-03-03 MED ORDER — LACTATED RINGERS IV SOLN: INTRAVENOUS | Status: DC

## 2024-03-03 MED ORDER — FENTANYL CITRATE PF 50 MCG/ML IJ SOSY: 25.0000 ug | PREFILLED_SYRINGE | INTRAMUSCULAR | Status: DC | PRN

## 2024-03-03 MED ORDER — DOCUSATE SODIUM 100 MG PO CAPS
100.0000 mg | ORAL_CAPSULE | Freq: Two times a day (BID) | ORAL | Status: DC
  Administered 2024-03-03 – 2024-03-10 (×15): 100 mg via ORAL
  Filled 2024-03-03 (×15): qty 1

## 2024-03-03 MED ORDER — LIDOCAINE 2% (20 MG/ML) 5 ML SYRINGE
INTRAMUSCULAR | Status: DC | PRN
  Administered 2024-03-03: 60 mg via INTRAVENOUS

## 2024-03-03 MED ORDER — FENTANYL CITRATE (PF) 100 MCG/2ML IJ SOLN
INTRAMUSCULAR | Status: DC | PRN
  Administered 2024-03-03: 50 ug via INTRAVENOUS

## 2024-03-03 MED ORDER — CHLORHEXIDINE GLUCONATE 0.12 % MT SOLN
15.0000 mL | Freq: Once | OROMUCOSAL | Status: AC
  Administered 2024-03-03: 15 mL via OROMUCOSAL

## 2024-03-03 MED ORDER — AMISULPRIDE (ANTIEMETIC) 5 MG/2ML IV SOLN: 10.0000 mg | Freq: Once | INTRAVENOUS | Status: DC | PRN

## 2024-03-03 SURGICAL SUPPLY — 23 items
BAG COUNTER SPONGE SURGICOUNT (BAG) IMPLANT
BNDG ADH 1X3 SHEER STRL LF (GAUZE/BANDAGES/DRESSINGS) IMPLANT
CHLORAPREP W/TINT 26 (MISCELLANEOUS) ×1 IMPLANT
COVER SURGICAL LIGHT HANDLE (MISCELLANEOUS) ×1 IMPLANT
GAUZE SPONGE 4X4 12PLY STRL (GAUZE/BANDAGES/DRESSINGS) IMPLANT
GLOVE BIOGEL M 7.0 STRL (GLOVE) ×1 IMPLANT
GLOVE BIOGEL PI IND STRL 7.5 (GLOVE) ×2 IMPLANT
GLOVE BIOGEL PI IND STRL 8 (GLOVE) ×1 IMPLANT
GLOVE BIOGEL PI IND STRL 8.5 (GLOVE) ×1 IMPLANT
GLOVE ECLIPSE 8.0 STRL XLNG CF (GLOVE) IMPLANT
GLOVE ORTHO TXT STRL SZ7.5 (GLOVE) ×2 IMPLANT
GLOVE SURG LX STRL 7.5 STRW (GLOVE) ×2 IMPLANT
GLOVE SURG ORTHO 8.0 STRL STRW (GLOVE) ×1 IMPLANT
GOWN SPEC L3 XXLG W/TWL (GOWN DISPOSABLE) ×2 IMPLANT
KIT TURNOVER KIT A (KITS) IMPLANT
MANIFOLD NEPTUNE II (INSTRUMENTS) ×1 IMPLANT
NDL SAFETY ECLIPSE 18X1.5 (NEEDLE) IMPLANT
PENCIL SMOKE EVACUATOR (MISCELLANEOUS) IMPLANT
PROTECTOR NERVE ULNAR (MISCELLANEOUS) ×1 IMPLANT
SOLUTION PRONTOSAN WOUND 350ML (IRRIGATION / IRRIGATOR) IMPLANT
SYR CONTROL 10ML LL (SYRINGE) IMPLANT
TOWEL GREEN STERILE FF (TOWEL DISPOSABLE) ×1 IMPLANT
TOWEL OR 17X26 10 PK STRL BLUE (TOWEL DISPOSABLE) ×2 IMPLANT

## 2024-03-03 NOTE — Transfer of Care (Signed)
 Immediate Anesthesia Transfer of Care Note  Patient: Dennis Osborn  Procedure(s) Performed: CLOSED MANIPULATION, JOINT, HIP (Right: Hip)  Patient Location: PACU  Anesthesia Type:General  Level of Consciousness: sedated  Airway & Oxygen Therapy: Patient Spontanous Breathing and Patient connected to nasal cannula oxygen  Post-op Assessment: Report given to RN and Post -op Vital signs reviewed and stable  Post vital signs: Reviewed and stable  Last Vitals:  Vitals Value Taken Time  BP 128/60 03/03/24 1650  Temp    Pulse 56 03/03/24 1654  Resp 16 03/03/24 1654  SpO2 99 % 03/03/24 1654  Vitals shown include unfiled device data.  Last Pain:  Vitals:   03/03/24 1537  TempSrc:   PainSc: 3       Patients Stated Pain Goal: 2 (03/03/24 1045)  Complications: No notable events documented.

## 2024-03-03 NOTE — Progress Notes (Signed)
 PROGRESS NOTE    Dennis Osborn  VHQ:[id number] DOB: 18-Nov-1940 DOA: 02/29/2024 PCP: Abram Abraham, NP-C   Brief Narrative:  83 y.o. male with medical history significant of hyperlipidemia, BPH presented with a fall and was found to have acute right subcapital femoral neck fracture.  Orthopedics was consulted.  He underwent surgical intervention by orthopedics on 03/01/2024.  PT now recommending SNF.  TOC consulted.  Assessment & Plan:   Acute right subcapital femoral neck fracture after a mechanical fall - Fall precautions.  Pain management.   - Status post surgical intervention by orthopedics on 03/01/2024.  Wound care/activity/DVT prophylaxis/pain management as per orthopedics recommendations. -PT initially recommended home health PT but now recommending SNF.  TOC consulted.  Mechanical fall - Apparently fell this morning while trying to get out of bed without assistance but denied any injury to head.  Complains of right hip pain and right hand pain.  Imaging pending.  Leukocytosis - Reactive.  Monitor intermittently.  No labs today.  Acute metabolic acidosis - Improved  Hyperlipidemia - Continue statin   DVT prophylaxis: Aspirin as per orthopedics  code Status: Full Family Communication: Sister at bedside on 03/01/2024 Disposition Plan: Status is: Inpatient Remains inpatient appropriate because: Of severity of illness.  Need for SNF placement.    Consultants: Orthopedics  Procedures: As above  Antimicrobials: Perioperative   Subjective: Patient seen and examined at bedside.  Apparently he fell this morning while trying to get out of bed without assistance but denied any injury to head.  Complains of right hip pain and right hand pain.  No fever or vomiting reported. Objective: Vitals:   03/02/24 2210 03/02/24 2255 03/03/24 0451 03/03/24 0713  BP: (!) 160/69  (!) 192/65 (!) 155/62  Pulse: (!) 53 (!) 56 (!) 56 62  Resp: 15 18 18 16   Temp: 98.7 F (37.1 C)  97.8 F  (36.6 C) 98.5 F (36.9 C)  TempSrc: Oral  Oral Oral  SpO2: 94% 95% 100% 94%  Weight:      Height:        Intake/Output Summary (Last 24 hours) at 03/03/2024 0740 Last data filed at 03/03/2024 0136 Gross per 24 hour  Intake 480 ml  Output 600 ml  Net -120 ml   Filed Weights   02/29/24 1031 03/01/24 1313  Weight: 72.6 kg 72.6 kg    Examination:  General: No acute distress.  Currently on room air. respiratory: Bilateral decreased breath sounds at bases with some crackles  CVS: Currently rate controlled; S1 and S2 are heard  abdominal: Soft, nontender, distended mildly; no organomegaly;  bowel sounds are heard normally extremities: No cyanosis; mild lower extremity edema present    Data Reviewed: I have personally reviewed following labs and imaging studies  CBC: Recent Labs  Lab 02/29/24 1119 03/01/24 0331  WBC 15.6* 12.3*  NEUTROABS 13.4*  --   HGB 14.7 13.3  HCT 43.4 41.5  MCV 93.5 98.1  PLT 246 170   Basic Metabolic Panel: Recent Labs  Lab 02/29/24 1119 03/01/24 0331  NA 139 137  K 4.0 4.0  CL 105 105  CO2 21* 22  GLUCOSE 111* 123*  BUN 18 28*  CREATININE 1.12 0.99  CALCIUM 9.2 8.6*   GFR: Estimated Creatinine Clearance: 53.7 mL/min (by C-G formula based on SCr of 0.99 mg/dL). Liver Function Tests: No results for input(s): "AST", "ALT", "ALKPHOS", "BILITOT", "PROT", "ALBUMIN" in the last 168 hours. No results for input(s): "LIPASE", "AMYLASE" in the last 168 hours.  No results for input(s): "AMMONIA" in the last 168 hours. Coagulation Profile: Recent Labs  Lab 02/29/24 1119  INR 1.0   Cardiac Enzymes: No results for input(s): "CKTOTAL", "CKMB", "CKMBINDEX", "TROPONINI" in the last 168 hours. BNP (last 3 results) No results for input(s): "PROBNP" in the last 8760 hours. HbA1C: No results for input(s): "HGBA1C" in the last 72 hours. CBG: No results for input(s): "GLUCAP" in the last 168 hours. Lipid Profile: No results for input(s): "CHOL",  "HDL", "LDLCALC", "TRIG", "CHOLHDL", "LDLDIRECT" in the last 72 hours. Thyroid Function Tests: No results for input(s): "TSH", "T4TOTAL", "FREET4", "T3FREE", "THYROIDAB" in the last 72 hours. Anemia Panel: No results for input(s): "VITAMINB12", "FOLATE", "FERRITIN", "TIBC", "IRON", "RETICCTPCT" in the last 72 hours. Sepsis Labs: No results for input(s): "PROCALCITON", "LATICACIDVEN" in the last 168 hours.  Recent Results (from the past 240 hours)  Surgical PCR screen     Status: None   Collection Time: 02/29/24 10:10 PM   Specimen: Nasal Mucosa; Nasal Swab  Result Value Ref Range Status   MRSA, PCR NEGATIVE NEGATIVE Final   Staphylococcus aureus NEGATIVE NEGATIVE Final    Comment: (NOTE) The Xpert SA Assay (FDA approved for NASAL specimens in patients 34 years of age and older), is one component of a comprehensive surveillance program. It is not intended to diagnose infection nor to guide or monitor treatment. Performed at [facility], [address], [place], [place] [address]          Radiology Studies: DG Pelvis Portable Result Date: 03/01/2024 CLINICAL DATA:  [id number] Closed subcapital fracture of neck of right femur, initial encounter [facility]) [id number] EXAM: PORTABLE PELVIS 1-2 VIEWS COMPARISON:  Feb 29, 2024 FINDINGS: There is no evidence of pelvic fracture or diastasis. No pelvic bone lesions are seen. Interval placement of a right hip arthroplasty, which is anatomically aligned without dislocation. No acute bone fracture of the femur. 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 Soft tissue edema and extensive subcutaneous emphysema along the right hip, expected findings at this stage in the postoperative recovery. IMPRESSION: Well-aligned right hip arthroplasty without dislocation or acute bone fracture. No radiopaque foreign body or retained surgical instrument. Electronically Signed   By: Rance Burrows M.D.   On: 03/01/2024 18:25    DG HIP UNILAT WITH PELVIS 1V RIGHT Result Date: 03/01/2024 CLINICAL DATA:  Right total hip arthroplasty. EXAM: DG HIP (WITH OR WITHOUT PELVIS) 1V RIGHT COMPARISON:  Radiographs 02/29/2024. FINDINGS: C-arm fluoroscopy was provided in the operating room without the presence of a radiologist.8 seconds fluoroscopy time. 1.0393 mGy air kerma. Eight C-arm fluoroscopic images were obtained intraoperatively and are submitted for post operative interpretation. Images demonstrate the performance of a right total hip arthroplasty. On the final images, the hardware appears well positioned. No complications are identified. Please see intraoperative findings for further detail. IMPRESSION: Intraoperative fluoroscopic guidance for right total hip arthroplasty. Electronically Signed   By: Elmon Hagedorn M.D.   On: 03/01/2024 16:57   DG C-Arm 1-60 Min-No Report Result Date: 03/01/2024 Fluoroscopy was utilized by the requesting physician.  No radiographic interpretation.   DG C-Arm 1-60 Min-No Report Result Date: 03/01/2024 Fluoroscopy was utilized by the requesting physician.  No radiographic interpretation.        Scheduled Meds:  aspirin  81 mg Oral BID   docusate sodium  100 mg Oral BID   pantoprazole  40 mg Oral Daily   rosuvastatin  10 mg Oral QHS   senna  1 tablet Oral  BID   Continuous Infusions:          Audria Leather, MD [facility] 03/03/2024, 7:40 AM

## 2024-03-03 NOTE — Progress Notes (Addendum)
.     Subjective:  Patient reports pain as mild to moderate.  Denies N/V/CP/SOB/Abd pain. He reports he is not having significant increase in pain just soreness in his right hip.  Patient found sitting on his bottom on the floor this morning. X-rays pending. Fall mat in place. No new pains reported.  He reports he did not get much sleep.    Objective:   VITALS:   Vitals:   03/02/24 2210 03/02/24 2255 03/03/24 0451 03/03/24 0713  BP: (!) 160/69  (!) 192/65 (!) 155/62  Pulse: (!) 53 (!) 56 (!) 56 62  Resp: 15 18 18 16   Temp: 98.7 F (37.1 C)  97.8 F (36.6 C) 98.5 F (36.9 C)  TempSrc: Oral  Oral Oral  SpO2: 94% 95% 100% 94%  Weight:      Height:        NAD Neurologically intact ABD soft Neurovascular intact Sensation intact distally Intact pulses distally Dorsiflexion/Plantar flexion intact Incision: dressing C/D/I No cellulitis present Compartment soft Some shortening and ER noted on exam today.   Lab Results  Component Value Date   WBC 12.3 (H) 03/01/2024   HGB 13.3 03/01/2024   HCT 41.5 03/01/2024   MCV 98.1 03/01/2024   PLT 170 03/01/2024   BMET    Component Value Date/Time   NA 137 03/01/2024 0331   K 4.0 03/01/2024 0331   CL 105 03/01/2024 0331   CO2 22 03/01/2024 0331   GLUCOSE 123 (H) 03/01/2024 0331   BUN 28 (H) 03/01/2024 0331   CREATININE 0.99 03/01/2024 0331   CALCIUM 8.6 (L) 03/01/2024 0331   GFRNONAA >60 03/01/2024 0331     Assessment/Plan: 2 Days Post-Op   Principal Problem:   Closed subcapital fracture of femur (HCC)   WBAT with walker DVT ppx: Aspirin, SCDs, TEDS PO pain control PT/OT: He ambulated 160 feet with PT yesterday.  Dispo:  - X-rays right hip pending.  - Patient under care of the medical team, disposition per their recommendation. Pain medication printed in chart.    Harman Lightning 03/03/2024, 8:29 AM   [facility]  [place] [address], [place], [place] [address] Phone:  [phone] [email or web address] Facebook  [employer]

## 2024-03-03 NOTE — Progress Notes (Signed)
 PT Cancellation Note  Patient Details Name: GWENDOLYN CARTHAN MRN: [id number] DOB: 06-23-1941   Cancelled Treatment:    Reason Eval/Treat Not Completed: Medical issues which prohibited therapy (noted pt fell while trying to get out of bed early this morning. Xrays pending. Will follow.)  Daymon Evans PT 03/03/2024  Acute Rehabilitation Services  Office [phone]

## 2024-03-03 NOTE — Progress Notes (Signed)
 Orthopedic Tech Progress Note Patient Details:  Dennis Osborn [birth year] [id number] Hip abduction brace has been ordered from [facility]  Patient ID: [name], male   DOB: Aug 10, 1941, 83 y.o.   MRN: [id number]  [name] 03/03/2024, 7:10 PM

## 2024-03-03 NOTE — Progress Notes (Signed)
 Spoke with RN. Patient got up on his own early this morning.  The bed alarm went off, and when staff responded, he was found sitting on the floor. Because c/o increased hip pain, so x-ray was ordered showing periprosthetic hip dislocation. Last PO intake was a fruit cup at 10am.  I have ordered shoot through lateral portable x-ray of right hip. Will need OR today for closed reduction right hip. Cont NPO.  Discussed with Abran Abrahams, patient's daughter.

## 2024-03-03 NOTE — TOC Progression Note (Signed)
 Transition of Care [facility]) - Progression Note    Patient Details  Name: Dennis Osborn MRN: [id number] Date of Birth: 09-Mar-1941  Transition [facility] Care [facility]) CM/SW Contact  Amaryllis Junior, [place] Phone Number: 03/03/2024, 2:46 PM  Clinical Narrative:    CSW reviewed bed offers with pt dtr. Bed choice [facility]. CSW confirmed w/ [facility] at SNF that bed is available. Per MD, pt will have surgery tomorrow and will likely be medically ready the day after. Will need to start ins auth after obtaining new PT note documenting pt progress after surgery.    Expected Discharge Plan: Skilled Nursing Facility Barriers to Discharge: Continued Medical Work up  Expected Discharge Plan and Services In-house Referral: Clinical Social Work     Living arrangements for the past 2 months: Single Family Home                                       Social Determinants of Health (SDOH) Interventions SDOH Screenings   Food Insecurity: No Food Insecurity (02/29/2024)  Housing: Low Risk  (02/29/2024)  Transportation Needs: No Transportation Needs (02/29/2024)  Utilities: Not At Risk (02/29/2024)  Depression (PHQ2-9): Low Risk  (01/18/2024)  Social Connections: Unknown (02/27/2022)   Received from [facility]  Tobacco Use: Low Risk  (02/29/2024)    Readmission Risk Interventions    03/01/2024   12:26 PM  Readmission Risk Prevention Plan  Post Dischage Appt Complete  Medication Screening Complete  Transportation Screening Complete

## 2024-03-03 NOTE — Op Note (Signed)
 OPERATIVE REPORT   03/03/2024  4:57 PM  PATIENT:  Dennis Osborn   SURGEON:  Jonnie Nettles, MD  ASSISTANT:  Staff.   PREOPERATIVE DIAGNOSIS: Periprosthetic right hip dislocation.  POSTOPERATIVE DIAGNOSIS:  Same.  PROCEDURE: Closed reduction right hip.  ANESTHESIA:   GETA.  ANTIBIOTICS: None.  IMPLANTS: None.  SPECIMENS: None.  COMPLICATIONS: None.  DISPOSITION: Stable to PACU.  SURGICAL INDICATIONS:  Dennis Osborn is a 83 y.o. male who underwent primary right total hip arthroplasty for displaced femoral neck fracture on 03/01/2024.  He had been doing very well with physical therapy.  Earlier this morning, the patient got out of bed without assistance and fell.  He had increasing right hip pain with inability to weight-bear.  X-rays revealed periprosthetic right hip dislocation.  He was indicated for closed reduction in the operating room.  We discussed the risk, benefits benefits, and alternatives, and he elected to proceed.  PROCEDURE IN DETAIL: The patient was correctly identified in the preop holding area using 2 identifiers.  The surgical site was marked by myself.  He was taken to the operating room, and general anesthesia was obtained on his bed.  Timeout was called, verifying side and site of surgery.  Antibiotics were not given, as none were indicated.  I flexed the hip and knee to 90 degrees and pulled inline traction.  The hip reduced with a palpable and audible clunk.  I performed stability testing which demonstrated excellent stability.  Portable AP hip x-ray was obtained, confirming concentric reduction.  Patient was then awakened from anesthesia and transferred to the PACU in stable condition.  POSTOPERATIVE PLAN: Postoperatively, the patient will be readmitted to the hospitalist service.  He may weight-bear as tolerated with a walker in a hip abduction brace.  He is to wear the brace at all times except for daily skin checks and hygiene for 6 weeks.  Posterior hip  precautions.  Continue aspirin for DVT prophylaxis.  He will follow-up in the office in 2 weeks for routine postoperative evaluation.

## 2024-03-03 NOTE — Progress Notes (Signed)
   03/03/24 0500  Level of Consciousness  Level of Consciousness Alert  MEWS COLOR  MEWS Score Color Green  Hot/Cold Interventions  Hot/Cold Interventions Ice Pack  PCA/Epidural/Spinal Assessment  Respiratory Pattern Regular;Unlabored  Glasgow Coma Scale  Eye Opening 4  Best Verbal Response (NON-intubated) 5  Best Motor Response 6  Glasgow Coma Scale Score 15  MEWS Score  MEWS Temp 0  MEWS Systolic 0  MEWS Pulse 0  MEWS RR 0  MEWS LOC 0  MEWS Score 0  Provider Notification  Provider Name/Title Bailey Lesser, NP  Date Provider Notified 03/03/24  Time Provider Notified 0502  Method of Notification Page  Notification Reason Fall  Provider response See new orders  Date of Provider Response 03/03/24  Time of Provider Response 0520   Pts bed alarm was going off, this writer went to pts room and found pt siting on his bottom, leaning onto his left side on the floor up against the door. Pt said he was trying to go home. Pt assessed for injuries. New skin tear found on top of his right hand. Having new pain to his right hip below the surgical site. Pt denies hitting his head. Pt assisted off the floor and back into the bed. Bed was in lowest position, bed alarm was on, call light was within reach, non skid socks were on, floor mats were in place prior to fall occurring. Pt reminded and showed how to use call light to call for assistance and to not get up on his own.  BP elevated, rest of VS wnl. A&OX3. All fall precautions in place. Pt given tylenol  for pain, ice packs placed on hip. Will continue to monitor.

## 2024-03-03 NOTE — Interval H&P Note (Signed)
 History and Physical Interval Note:  03/03/2024 4:26 PM  [name]  has presented today for surgery, with the diagnosis of RIGHT HIP DISLOCATION.  The various methods of treatment have been discussed with the patient and family. After consideration of risks, benefits and other options for treatment, the patient has consented to  Procedure(s): CLOSED MANIPULATION, JOINT, HIP (Right) as a surgical intervention.  The patient's history has been reviewed, patient examined, no change in status, stable for surgery.  I have reviewed the patient's chart and labs.  Questions were answered to the patient's satisfaction.     [name] [name]

## 2024-03-03 NOTE — Anesthesia Preprocedure Evaluation (Addendum)
 Anesthesia Evaluation  Patient identified by MRN, date of birth, ID band Patient awake    Reviewed: Allergy & Precautions, NPO status , Patient's Chart, lab work & pertinent test results  Airway Mallampati: II  TM Distance: >3 FB Neck ROM: Full    Dental  (+) Missing   Pulmonary neg pulmonary ROS   Pulmonary exam normal        Cardiovascular negative cardio ROS Normal cardiovascular exam     Neuro/Psych  PSYCHIATRIC DISORDERS      negative neurological ROS     GI/Hepatic negative GI ROS, Neg liver ROS,,,  Endo/Other  negative endocrine ROS    Renal/GU negative Renal ROS     Musculoskeletal   Abdominal   Peds  Hematology negative hematology ROS (+)   Anesthesia Other Findings RIGHT HIP DISLOCATION  Reproductive/Obstetrics                             Anesthesia Physical Anesthesia Plan  ASA: 2  Anesthesia Plan: General   Post-op Pain Management:    Induction: Intravenous  PONV Risk Score and Plan: 2 and Ondansetron , Dexamethasone  and Treatment may vary due to age or medical condition  Airway Management Planned: Mask  Additional Equipment:   Intra-op Plan:   Post-operative Plan:   Informed Consent: I have reviewed the patients History and Physical, chart, labs and discussed the procedure including the risks, benefits and alternatives for the proposed anesthesia with the patient or authorized representative who has indicated his/her understanding and acceptance.     Dental advisory given  Plan Discussed with: CRNA  Anesthesia Plan Comments:        Anesthesia Quick Evaluation

## 2024-03-03 NOTE — Progress Notes (Addendum)
    Patient Name: Dennis Osborn           DOB: 10-21-1940  MRN: [id number]      Admission Date: 02/29/2024  Attending Provider: Audria Leather, MD  Primary Diagnosis: Closed subcapital fracture of femur [facility])   Level of care: Med-Surg    CROSS COVER NOTE   Date of Service   03/03/2024   Dennis Osborn, 83 y.o. male, was admitted on 02/29/2024 for Closed subcapital fracture of femur (HCC).    HPI/Events of Note   Unwitnessed fall Fall occurred while patient was getting out of bed without assistance.  Patient was found sitting on the floor, body leaning more towards the left side. Patient is currently A/O x3.  Hemodynamically stable. Patient denies injury to head. He denies having prior pain tonight, but is now reporting uncontrolled pain to his right hip.  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 No visible sign of injury or deformities reported.  Skin tear to right hand reported. ROM at baseline. Patient was able to get up with assistance.   Interventions/ Plan    Images have been ordered Nursing staff to continue monitoring for change in acute symptoms, mobility, and pain.        Lakela Kuba, DNP, ACNPC- AG [facility] [place]

## 2024-03-04 DIAGNOSIS — S72011A Unspecified intracapsular fracture of right femur, initial encounter for closed fracture: Secondary | ICD-10-CM | POA: Diagnosis not present

## 2024-03-04 NOTE — Plan of Care (Signed)
   Problem: Coping: Goal: Level of anxiety will decrease Outcome: Progressing   Problem: Safety: Goal: Ability to remain free from injury will improve Outcome: Progressing   Problem: Pain Managment: Goal: General experience of comfort will improve and/or be controlled Outcome: Progressing

## 2024-03-04 NOTE — Plan of Care (Signed)
   Problem: Coping: Goal: Level of anxiety will decrease Outcome: Progressing   Problem: Pain Managment: Goal: General experience of comfort will improve and/or be controlled Outcome: Progressing   Problem: Safety: Goal: Ability to remain free from injury will improve Outcome: Progressing

## 2024-03-04 NOTE — Progress Notes (Signed)
 PROGRESS NOTE    Dennis Osborn  ZOX:[id number] DOB: Apr 11, 1941 DOA: 02/29/2024 PCP: Abram Abraham, NP-C   Brief Narrative:  83 y.o. male with medical history significant of hyperlipidemia, BPH presented with a fall and was found to have acute right subcapital femoral neck fracture.  Orthopedics was consulted.  He underwent surgical intervention by orthopedics on 03/01/2024.  PT now recommending SNF.  TOC consulted. Patient fell on the morning of 03/03/24: was found to have periprosthetic hip dislocation requiring surgical intervention again.  Assessment & Plan:   Acute right subcapital femoral neck fracture after a mechanical fall - Fall precautions.  Pain management.   - Status post surgical intervention by orthopedics on 03/01/2024.  Wound care/activity/DVT prophylaxis/pain management as per orthopedics recommendations. -PT initially recommended home health PT but now recommending SNF.  TOC consulted.  Mechanical fall with periprosthetic hip dislocation  - S/P surgical intervention on 03/03/24  Leukocytosis - Reactive.  Monitor intermittently.  No labs today.  Acute metabolic acidosis - Improved  Hyperlipidemia - Continue statin   DVT prophylaxis: Aspirin  as per orthopedics  code Status: Full Family Communication: Daughter at bedside  Disposition Plan: Status is: Inpatient Remains inpatient appropriate because: Of severity of illness.  Need for SNF placement.    Consultants: Orthopedics  Procedures: As above  Antimicrobials: Perioperative   Subjective: Patient seen and examined at bedside.  Having intermittent right hip pain. No vomiting or fever reported. Objective: Vitals:   03/03/24 1735 03/03/24 2002 03/04/24 0155 03/04/24 0514  BP: (!) 187/74 (!) 169/66 (!) 164/66 (!) 153/69  Pulse: (!) 57 60 65 61  Resp: 17 16 18 16   Temp: 98.3 F (36.8 C) 98.9 F (37.2 C) 98 F (36.7 C) 99.3 F (37.4 C)  TempSrc: Oral Oral Oral Oral  SpO2: 100% 98% 99% 99%  Weight:       Height:        Intake/Output Summary (Last 24 hours) at 03/04/2024 0731 Last data filed at 03/04/2024 0515 Gross per 24 hour  Intake 220 ml  Output 1100 ml  Net -880 ml   Filed Weights   02/29/24 1031 03/01/24 1313 03/03/24 1538  Weight: 72.6 kg 72.6 kg 72.6 kg    Examination:  General: On room air. In no distress. respiratory: Decreased breath sounds at bases with no wheezing CVS: S1 S2 heard. Rate currently controlled abdominal: Soft, nontender, distended slightly; no organomegaly;  bowel sounds heard  extremities: No clubbing; trace lower extremity edema present    Data Reviewed: I have personally reviewed following labs and imaging studies  CBC: Recent Labs  Lab 02/29/24 1119 03/01/24 0331  WBC 15.6* 12.3*  NEUTROABS 13.4*  --   HGB 14.7 13.3  HCT 43.4 41.5  MCV 93.5 98.1  PLT 246 170   Basic Metabolic Panel: Recent Labs  Lab 02/29/24 1119 03/01/24 0331  NA 139 137  K 4.0 4.0  CL 105 105  CO2 21* 22  GLUCOSE 111* 123*  BUN 18 28*  CREATININE 1.12 0.99  CALCIUM  9.2 8.6*   GFR: Estimated Creatinine Clearance: 53.7 mL/min (by C-G formula based on SCr of 0.99 mg/dL). Liver Function Tests: No results for input(s): "AST", "ALT", "ALKPHOS", "BILITOT", "PROT", "ALBUMIN" in the last 168 hours. No results for input(s): "LIPASE", "AMYLASE" in the last 168 hours. No results for input(s): "AMMONIA" in the last 168 hours. Coagulation Profile: Recent Labs  Lab 02/29/24 1119  INR 1.0   Cardiac Enzymes: No results for input(s): "CKTOTAL", "CKMB", "CKMBINDEX", "TROPONINI"  in the last 168 hours. BNP (last 3 results) No results for input(s): "PROBNP" in the last 8760 hours. HbA1C: No results for input(s): "HGBA1C" in the last 72 hours. CBG: No results for input(s): "GLUCAP" in the last 168 hours. Lipid Profile: No results for input(s): "CHOL", "HDL", "LDLCALC", "TRIG", "CHOLHDL", "LDLDIRECT" in the last 72 hours. Thyroid Function Tests: No results for  input(s): "TSH", "T4TOTAL", "FREET4", "T3FREE", "THYROIDAB" in the last 72 hours. Anemia Panel: No results for input(s): "VITAMINB12", "FOLATE", "FERRITIN", "TIBC", "IRON", "RETICCTPCT" in the last 72 hours. Sepsis Labs: No results for input(s): "PROCALCITON", "LATICACIDVEN" in the last 168 hours.  Recent Results (from the past 240 hours)  Surgical PCR screen     Status: None   Collection Time: 02/29/24 10:10 PM   Specimen: Nasal Mucosa; Nasal Swab  Result Value Ref Range Status   MRSA, PCR NEGATIVE NEGATIVE Final   Staphylococcus aureus NEGATIVE NEGATIVE Final    Comment: (NOTE) The Xpert SA Assay (FDA approved for NASAL specimens in patients 52 years of age and older), is one component of a comprehensive surveillance program. It is not intended to diagnose infection nor to guide or monitor treatment. Performed at [facility], [address] [place], [place] [address]          Radiology Studies: DG HIP UNILAT WITH PELVIS 1V RIGHT Result Date: 03/03/2024 CLINICAL DATA:  Status post reduction of dislocated total right hip arthroplasty. EXAM: DG HIP (WITH OR WITHOUT PELVIS) 1V RIGHT COMPARISON:  Pelvis and right hip radiographs 03/03/2024 FINDINGS: Single frontal view of the right hip. There is improved, now appropriate alignment of the right acetabular cup and femoral head of total right hip arthroplasty prosthesis. No perihardware lucency is seen to indicate hardware failure or loosening. The distal tip of the femoral stem is not imaged. Moderate lateral right hip soft tissue swelling and edema, similar to prior. IMPRESSION: Interval relocation of the total right hip arthroplasty. Electronically Signed   By: Bertina Broccoli M.D.   On: 03/03/2024 18:14   DG Pelvis 1-2 Views Result Date: 03/03/2024 CLINICAL DATA:  Right hip pain following a fall today. EXAM: PELVIS - 1-2 VIEW COMPARISON:  03/01/2024 FINDINGS: Interval superior dislocation of the femoral head component  of the right hip prosthesis. No visible fracture. Lower lumbar spine degenerative changes. IMPRESSION: Superior dislocation of the femoral head component of the right hip prosthesis. These results will be called to the ordering clinician or representative by the Radiologist Assistant, and communication documented in the PACS or [employer]. Electronically Signed   By: Catherin Closs M.D.   On: 03/03/2024 14:44   DG Femur Min 2 Views Right Result Date: 03/03/2024 CLINICAL DATA:  Fall and right hip pain. EXAM: RIGHT FEMUR 2 VIEWS COMPARISON:  Pelvic radiograph dated 03/03/2024. FINDINGS: Total right hip arthroplasty. There is superior dislocation of the femoral component of the arthroplasty. No acute fracture. Small pockets of subcutaneous air may represent skin ablation. Clinical correlation recommended. IMPRESSION: Superior dislocation of the femoral component of the right hip arthroplasty. Electronically Signed   By: Angus Bark M.D.   On: 03/03/2024 14:43   DG HIP PORT UNILAT WITH PELVIS 1V RIGHT Result Date: 03/03/2024 CLINICAL DATA:  Closed dislocation of right hip. EXAM: DG HIP (WITH OR WITHOUT PELVIS) 1V PORT RIGHT COMPARISON:  Same day. FINDINGS: Single cross-table lateral projection of the hip was obtained. This demonstrates superior dislocation of right femoral prosthesis. IMPRESSION: Superior dislocation of right femoral prosthesis is noted on lateral  radiograph only. Electronically Signed   By: Rosalene Colon M.D.   On: 03/03/2024 13:39        Scheduled Meds:  aspirin   81 mg Oral BID   docusate sodium   100 mg Oral BID   pantoprazole   40 mg Oral Daily   rosuvastatin   10 mg Oral QHS   senna  1 tablet Oral BID   Continuous Infusions:          Audria Leather, MD [facility] 03/04/2024, 7:31 AM

## 2024-03-04 NOTE — Progress Notes (Addendum)
 Physical Therapy Treatment Patient Details Name: Dennis Osborn MRN: [id number] DOB: Feb 13, 1941 Today's Date: 03/04/2024   History of Present Illness 83 y.o. male admitted with R hip fx 02/29/24, s/p THA-AA 03/01/24.  Pt fell morning of 5/16, imaging showed hip dislocation, s/p closed reduction.  Past medical history significant of hyperlipidemia, BPH.    PT Comments  Pt reports pain medication made him confused and gave him nightmares, he stated this is why he fell yesterday morning. Pt now with posterior precautions R hip, and has R hip abduction brace. He ambulated 33' with RW, no loss of balance, he denied pain with mobility. Reviewed posterior precautions, handout issued.  Patient will benefit from continued inpatient follow up therapy, <3 hours/day.    If plan is discharge home, recommend the following: A little help with bathing/dressing/bathroom;Assistance with cooking/housework;Assist for transportation;Help with stairs or ramp for entrance   Can travel by private vehicle     Yes  Equipment Recommendations  Rolling walker (2 wheels)    Recommendations for Other Services       Precautions / Restrictions Precautions Precautions: Fall;Posterior Hip Precaution Booklet Issued: Yes (comment) Recall of Precautions/Restrictions: Intact Precaution/Restrictions Comments: fall 03/03/24 Required Braces or Orthoses: Other Brace Other Brace: R hip abduction brace, wear at all times except for bathing/skin checks Restrictions RLE Weight Bearing Per Provider Order: Weight bearing as tolerated     Mobility  Bed Mobility Overal bed mobility: Needs Assistance Bed Mobility: Supine to Sit     Supine to sit: Mod assist     General bed mobility comments: assist to raise trunk    Transfers Overall transfer level: Needs assistance Equipment used: Rolling walker (2 wheels) Transfers: Sit to/from Stand Sit to Stand: Contact guard assist           General transfer comment: VCs hand  placement    Ambulation/Gait Ambulation/Gait assistance: Contact guard assist Gait Distance (Feet): 130 Feet Assistive device: Rolling walker (2 wheels) Gait Pattern/deviations: Step-through pattern, Decreased stride length Gait velocity: WFL     General Gait Details: steady, no loss of balance, R hip abduction brace in place   Stairs             Wheelchair Mobility     Tilt Bed    Modified Rankin (Stroke Patients Only)       Balance Overall balance assessment: Needs assistance, History of Falls   Sitting balance-Leahy Scale: Good     Standing balance support: Bilateral upper extremity supported, During functional activity, Reliant on assistive device for balance Standing balance-Leahy Scale: Fair                              Hotel manager: Impaired Factors Affecting Communication: 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)  Cognition Arousal: Alert Behavior During Therapy: WFL for tasks assessed/performed   PT - Cognitive impairments: No apparent impairments                         Following commands: Intact      Cueing    Exercises Total Joint Exercises Ankle Circles/Pumps: AROM, Both, 10 reps, Supine    General Comments        Pertinent Vitals/Pain Pain Assessment Pain Score: 0-No pain    Home Living  Prior Function            PT Goals (current goals can now be found in the care plan section) Acute Rehab PT Goals Patient Stated Goal: DC home PT Goal Formulation: With patient Time For Goal Achievement: 03/09/24 Potential to Achieve Goals: Good Progress towards PT goals: Progressing toward goals    Frequency    Min 6X/week      PT Plan      Co-evaluation              AM-PAC PT "6 Clicks" Mobility   Outcome Measure  Help needed turning from your back to your side while in a flat bed without using bedrails?: None Help  needed moving from lying on your back to sitting on the side of a flat bed without using bedrails?: A Little Help needed moving to and from a bed to a chair (including a wheelchair)?: A Little Help needed standing up from a chair using your arms (e.g., wheelchair or bedside chair)?: A Little Help needed to walk in hospital room?: A Little Help needed climbing 3-5 steps with a railing? : A Little 6 Click Score: 19    End of Session Equipment Utilized During Treatment: Gait belt Activity Tolerance: Patient tolerated treatment well Patient left: in chair;with chair alarm set;with call bell/phone within reach;with nursing/sitter in room Nurse Communication: Mobility status PT Visit Diagnosis: Difficulty in walking, not elsewhere classified (R26.2);Pain;History of falling (Z91.81) Pain - Right/Left: Right Pain - part of body: Hip     Time: 6962-9528 PT Time Calculation (min) (ACUTE ONLY): 14 min  Charges:    $Gait Training: 8-22 mins PT General Charges $$ ACUTE PT VISIT: 1 Visit                     Daymon Evans PT 03/04/2024  Acute Rehabilitation Services  Office [phone]

## 2024-03-05 DIAGNOSIS — S72011A Unspecified intracapsular fracture of right femur, initial encounter for closed fracture: Secondary | ICD-10-CM | POA: Diagnosis not present

## 2024-03-05 NOTE — Progress Notes (Signed)
 Physical Therapy Treatment Patient Details Name: Dennis Osborn MRN: [id number] DOB: 1941-02-12 Today's Date: 03/05/2024   History of Present Illness 83 y.o. male admitted with R hip fx 02/29/24, s/p THA-AA 03/01/24.  Pt fell morning of 5/16, imaging showed hip dislocation, s/p closed reduction.  Past medical history significant of hyperlipidemia, BPH.    PT Comments  Pt in bed with family present, agreeable to session. Pt denies pain, states that he has not had BM or the urge for BM in 2 days. Pt on 2L O2, baseline on RA, trialed sitting EOB on RA satting 100%, following 143ft amb with RW, sats 96% on RA, nursing notified. Pt demonstrates decreased quality of movement with Sit to Stand, has increased reliance on walker and rotates trunk for efficient leverage, walker stabilized and CGA provided. Pt is able to amb with good form and no LOB on level surfaces at SBA level, including directional changes. Pt returns to bed with family present, denies complaints. Pt continues to require continue skilled PT at dc to maintain safety in the presence of decreased support at home and safety while he progresses and recovers from this hospital stay.     If plan is discharge home, recommend the following: A little help with bathing/dressing/bathroom;Assistance with cooking/housework;Assist for transportation;Help with stairs or ramp for entrance   Can travel by private vehicle     Yes  Equipment Recommendations  Rolling walker (2 wheels)    Recommendations for Other Services       Precautions / Restrictions Precautions Precautions: Fall;Posterior Hip Precaution Booklet Issued: Yes (comment) Recall of Precautions/Restrictions: Intact Precaution/Restrictions Comments: fall 03/03/24 Required Braces or Orthoses: Other Brace Other Brace: R hip abduction brace, wear at all times except for bathing/skin checks Restrictions Weight Bearing Restrictions Per Provider Order: Yes RLE Weight Bearing Per Provider  Order: Weight bearing as tolerated     Mobility  Bed Mobility Overal bed mobility: Needs Assistance Bed Mobility: Supine to Sit, Sit to Supine     Supine to sit: Supervision Sit to supine: Supervision   General bed mobility comments: gait belt used by pt to assist RLE in/out of bed    Transfers Overall transfer level: Needs assistance Equipment used: Rolling walker (2 wheels) Transfers: Sit to/from Stand Sit to Stand: Contact guard assist           General transfer comment: stabilization to walker    Ambulation/Gait Ambulation/Gait assistance: Contact guard assist Gait Distance (Feet): 130 Feet Assistive device: Rolling walker (2 wheels) Gait Pattern/deviations: Step-through pattern, Decreased stride length, Wide base of support Gait velocity: WFL     General Gait Details: steady, no loss of balance, R hip abduction brace in place   Stairs             Wheelchair Mobility     Tilt Bed    Modified Rankin (Stroke Patients Only)       Balance Overall balance assessment: Needs assistance, History of Falls Sitting-balance support: Feet supported, No upper extremity supported Sitting balance-Leahy Scale: Good     Standing balance support: Bilateral upper extremity supported, During functional activity, Reliant on assistive device for balance Standing balance-Leahy Scale: Fair Standing balance comment: requires constant UE support for balance                            Communication Communication Factors Affecting Communication: Hearing impaired (has hearing aides but still HOH)  Cognition Arousal: Alert Behavior During Therapy: [facility]  for tasks assessed/performed   PT - Cognitive impairments: No apparent impairments                                Cueing    Exercises      General Comments        Pertinent Vitals/Pain Pain Assessment Pain Assessment: No/denies pain Pain Intervention(s): Monitored during session     Home Living                          Prior Function            PT Goals (current goals can now be found in the care plan section) Acute Rehab PT Goals Patient Stated Goal: DC home PT Goal Formulation: With patient Time For Goal Achievement: 03/09/24 Potential to Achieve Goals: Good Progress towards PT goals: Progressing toward goals    Frequency    Min 6X/week      PT Plan      Co-evaluation              AM-PAC PT "6 Clicks" Mobility   Outcome Measure  Help needed turning from your back to your side while in a flat bed without using bedrails?: None Help needed moving from lying on your back to sitting on the side of a flat bed without using bedrails?: A Little Help needed moving to and from a bed to a chair (including a wheelchair)?: A Little Help needed standing up from a chair using your arms (e.g., wheelchair or bedside chair)?: A Little Help needed to walk in hospital room?: A Little Help needed climbing 3-5 steps with a railing? : A Little 6 Click Score: 19    End of Session Equipment Utilized During Treatment: Gait belt Activity Tolerance: Patient tolerated treatment well Patient left: with call bell/phone within reach;with nursing/sitter in room;in bed;with bed alarm set Nurse Communication: Mobility status PT Visit Diagnosis: Difficulty in walking, not elsewhere classified (R26.2);Pain;History of falling (Z91.81) Pain - Right/Left: Right Pain - part of body: Hip     Time: 1914-7829 PT Time Calculation (min) (ACUTE ONLY): 35 min  Charges:    $Gait Training: 23-37 mins PT General Charges $$ ACUTE PT VISIT: 1 Visit                     Darien Eden, PT Acute Rehabilitation Services Office: [phone] 03/05/2024    Serafin Dames 03/05/2024, 3:56 PM

## 2024-03-05 NOTE — TOC Progression Note (Signed)
 Transition of Care [facility]) - Progression Note    Patient Details  Name: Dennis Osborn MRN: [id number] Date of Birth: 06-23-1941  [facility] CM/SW Contact  Bari Leys, RN Phone Number: 03/05/2024, 1:23 PM  Clinical Narrative:   SNF auth initiated via Gypsy Lesser ID [id number], auth pending.     Expected Discharge Plan: Skilled Nursing Facility Barriers to Discharge: Continued Medical Work up  Expected Discharge Plan and Services In-house Referral: Clinical Social Work     Living arrangements for the past 2 months: Single Family Home                                       Social Determinants of Health (SDOH) Interventions SDOH Screenings   Food Insecurity: No Food Insecurity (02/29/2024)  Housing: Low Risk  (02/29/2024)  Transportation Needs: No Transportation Needs (02/29/2024)  Utilities: Not At Risk (02/29/2024)  Depression (PHQ2-9): Low Risk  (01/18/2024)  Social Connections: Unknown (02/27/2022)   Received from [facility]  Tobacco Use: Low Risk  (03/03/2024)    Readmission Risk Interventions    03/01/2024   12:26 PM  Readmission Risk Prevention Plan  Post Dischage Appt Complete  Medication Screening Complete  Transportation Screening Complete

## 2024-03-05 NOTE — Anesthesia Postprocedure Evaluation (Signed)
 Anesthesia Post Note  Patient: Dennis Osborn  Procedure(s) Performed: CLOSED MANIPULATION, JOINT, HIP (Right: Hip)     Patient location during evaluation: PACU Anesthesia Type: General Level of consciousness: awake and alert Pain management: pain level controlled Vital Signs Assessment: post-procedure vital signs reviewed and stable Respiratory status: spontaneous breathing, nonlabored ventilation, respiratory function stable and patient connected to nasal cannula oxygen Cardiovascular status: blood pressure returned to baseline and stable Postop Assessment: no apparent nausea or vomiting Anesthetic complications: no   No notable events documented.  Last Vitals:  Vitals:   03/04/24 2146 03/05/24 0540  BP: (!) 164/78 (!) 161/65  Pulse: 61 (!) 57  Resp: 16 16  Temp: 36.8 C 36.7 C  SpO2: 95% 92%    Last Pain:  Vitals:   03/05/24 0825  TempSrc:   PainSc: 1                  Janell Keeling P Radwan Cowley

## 2024-03-05 NOTE — Progress Notes (Signed)
 PROGRESS NOTE    Dennis Osborn  WGN:[id number] DOB: [birth year] DOA: 02/29/2024 PCP: Abram Abraham, NP-C   Brief Narrative:  83 y.o. male with medical history significant of hyperlipidemia, BPH presented with a fall and was found to have acute right subcapital femoral neck fracture.  Orthopedics was consulted.  He underwent surgical intervention by orthopedics on 03/01/2024.  PT now recommending SNF.  TOC consulted. Patient fell on the morning of 03/03/24: was found to have periprosthetic hip dislocation requiring surgical intervention again.  Assessment & Plan:   Acute right subcapital femoral neck fracture after a mechanical fall - Fall precautions.  Pain management.   - Status post surgical intervention by orthopedics on 03/01/2024.  Wound care/activity/DVT prophylaxis/pain management as per orthopedics recommendations. -PT recommending SNF.  TOC consulted.  Mechanical fall with periprosthetic hip dislocation  - S/P surgical intervention on 03/03/24  Leukocytosis - Reactive.  Monitor intermittently.  No labs today.  Acute metabolic acidosis - Improved  Hyperlipidemia - Continue statin   DVT prophylaxis: Aspirin  as per orthopedics  code Status: Full Family Communication: Daughter at bedside  Disposition Plan: Status is: Inpatient Remains inpatient appropriate because: Of severity of illness.  Need for SNF placement.    Consultants: Orthopedics  Procedures: As above  Antimicrobials: Perioperative   Subjective: Patient seen and examined at bedside.  Still having intermittent right hip pain. No chest pain, shortness of breath or fever noted Objective: Vitals:   03/04/24 1423 03/04/24 1816 03/04/24 2146 03/05/24 0540  BP: (!) 157/66  (!) 164/78 (!) 161/65  Pulse: 69  61 (!) 57  Resp: 18  16 16   Temp: 100 F (37.8 C) 99.4 F (37.4 C) 98.2 F (36.8 C) 98.1 F (36.7 C)  TempSrc: Oral Oral Oral Oral  SpO2: 94%  95% 92%  Weight:      Height:        Intake/Output  Summary (Last 24 hours) at 03/05/2024 0729 Last data filed at 03/05/2024 0500 Gross per 24 hour  Intake 360 ml  Output 400 ml  Net -40 ml   Filed Weights   02/29/24 1031 03/01/24 1313 03/03/24 1538  Weight: 72.6 kg 72.6 kg 72.6 kg    Examination:  General: No acute distress; currently on room air respiratory: Bilateral decreased breath sounds at bases with scattered crackles CVS: S1 S2 heard. Mild intermittent bradycardia noted abdominal: Soft, nontender, mildly distended; no organomegaly;  bowel sounds are heard  extremities: Mild lower extremity edema present; no cyanosis    Data Reviewed: I have personally reviewed following labs and imaging studies  CBC: Recent Labs  Lab 02/29/24 1119 03/01/24 0331  WBC 15.6* 12.3*  NEUTROABS 13.4*  --   HGB 14.7 13.3  HCT 43.4 41.5  MCV 93.5 98.1  PLT 246 170   Basic Metabolic Panel: Recent Labs  Lab 02/29/24 1119 03/01/24 0331  NA 139 137  K 4.0 4.0  CL 105 105  CO2 21* 22  GLUCOSE 111* 123*  BUN 18 28*  CREATININE 1.12 0.99  CALCIUM  9.2 8.6*   GFR: Estimated Creatinine Clearance: 53.7 mL/min (by C-G formula based on SCr of 0.99 mg/dL). Liver Function Tests: No results for input(s): "AST", "ALT", "ALKPHOS", "BILITOT", "PROT", "ALBUMIN" in the last 168 hours. No results for input(s): "LIPASE", "AMYLASE" in the last 168 hours. No results for input(s): "AMMONIA" in the last 168 hours. Coagulation Profile: Recent Labs  Lab 02/29/24 1119  INR 1.0   Cardiac Enzymes: No results for input(s): "CKTOTAL", "CKMB", "CKMBINDEX", "  TROPONINI" in the last 168 hours. BNP (last 3 results) No results for input(s): "PROBNP" in the last 8760 hours. HbA1C: No results for input(s): "HGBA1C" in the last 72 hours. CBG: No results for input(s): "GLUCAP" in the last 168 hours. Lipid Profile: No results for input(s): "CHOL", "HDL", "LDLCALC", "TRIG", "CHOLHDL", "LDLDIRECT" in the last 72 hours. Thyroid Function Tests: No results for  input(s): "TSH", "T4TOTAL", "FREET4", "T3FREE", "THYROIDAB" in the last 72 hours. Anemia Panel: No results for input(s): "VITAMINB12", "FOLATE", "FERRITIN", "TIBC", "IRON", "RETICCTPCT" in the last 72 hours. Sepsis Labs: No results for input(s): "PROCALCITON", "LATICACIDVEN" in the last 168 hours.  Recent Results (from the past 240 hours)  Surgical PCR screen     Status: None   Collection Time: 02/29/24 10:10 PM   Specimen: Nasal Mucosa; Nasal Swab  Result Value Ref Range Status   MRSA, PCR NEGATIVE NEGATIVE Final   Staphylococcus aureus NEGATIVE NEGATIVE Final    Comment: (NOTE) The Xpert SA Assay (FDA approved for NASAL specimens in patients 10 years of age and older), is one component of a comprehensive surveillance program. It is not intended to diagnose infection nor to guide or monitor treatment. Performed at [facility], [address], [place], [place] [address]          Radiology Studies: DG HIP UNILAT WITH PELVIS 1V RIGHT Result Date: 03/03/2024 CLINICAL DATA:  Status post reduction of dislocated total right hip arthroplasty. EXAM: DG HIP (WITH OR WITHOUT PELVIS) 1V RIGHT COMPARISON:  Pelvis and right hip radiographs 03/03/2024 FINDINGS: Single frontal view of the right hip. There is improved, now appropriate alignment of the right acetabular cup and femoral head of total right hip arthroplasty prosthesis. No perihardware lucency is seen to indicate hardware failure or loosening. The distal tip of the femoral stem is not imaged. Moderate lateral right hip soft tissue swelling and edema, similar to prior. IMPRESSION: Interval relocation of the total right hip arthroplasty. Electronically Signed   By: Bertina Broccoli M.D.   On: 03/03/2024 18:14   DG Pelvis 1-2 Views Result Date: 03/03/2024 CLINICAL DATA:  Right hip pain following a fall today. EXAM: PELVIS - 1-2 VIEW COMPARISON:  03/01/2024 FINDINGS: Interval superior dislocation of the femoral head component  of the right hip prosthesis. No visible fracture. Lower lumbar spine degenerative changes. IMPRESSION: Superior dislocation of the femoral head component of the right hip prosthesis. These results will be called to the ordering clinician or representative by the Radiologist Assistant, and communication documented in the PACS or [employer]. Electronically Signed   By: Catherin Closs M.D.   On: 03/03/2024 14:44   DG Femur Min 2 Views Right Result Date: 03/03/2024 CLINICAL DATA:  Fall and right hip pain. EXAM: RIGHT FEMUR 2 VIEWS COMPARISON:  Pelvic radiograph dated 03/03/2024. FINDINGS: Total right hip arthroplasty. There is superior dislocation of the femoral component of the arthroplasty. No acute fracture. Small pockets of subcutaneous air may represent skin ablation. Clinical correlation recommended. IMPRESSION: Superior dislocation of the femoral component of the right hip arthroplasty. Electronically Signed   By: Angus Bark M.D.   On: 03/03/2024 14:43   DG HIP PORT UNILAT WITH PELVIS 1V RIGHT Result Date: 03/03/2024 CLINICAL DATA:  Closed dislocation of right hip. EXAM: DG HIP (WITH OR WITHOUT PELVIS) 1V PORT RIGHT COMPARISON:  Same day. FINDINGS: Single cross-table lateral projection of the hip was obtained. This demonstrates superior dislocation of right femoral prosthesis. IMPRESSION: Superior dislocation of right femoral prosthesis is noted on  lateral radiograph only. Electronically Signed   By: Rosalene Colon M.D.   On: 03/03/2024 13:39        Scheduled Meds:  aspirin   81 mg Oral BID   docusate sodium   100 mg Oral BID   pantoprazole   40 mg Oral Daily   rosuvastatin   10 mg Oral QHS   senna  1 tablet Oral BID   Continuous Infusions:          Audria Leather, MD [facility] 03/05/2024, 7:29 AM

## 2024-03-05 NOTE — Plan of Care (Signed)
   Problem: Coping: Goal: Level of anxiety will decrease Outcome: Progressing   Problem: Pain Managment: Goal: General experience of comfort will improve and/or be controlled Outcome: Progressing   Problem: Safety: Goal: Ability to remain free from injury will improve Outcome: Progressing

## 2024-03-06 ENCOUNTER — Encounter (HOSPITAL_COMMUNITY): Payer: Self-pay | Admitting: Orthopedic Surgery

## 2024-03-06 DIAGNOSIS — S72011A Unspecified intracapsular fracture of right femur, initial encounter for closed fracture: Secondary | ICD-10-CM | POA: Diagnosis not present

## 2024-03-06 NOTE — Progress Notes (Signed)
 Physical Therapy Treatment Patient Details Name: Dennis Osborn MRN: [id number] DOB: Mar 24, 1941 Today's Date: 03/06/2024   History of Present Illness 83 y.o. male admitted with R hip fx 02/29/24, s/p THA-AA 03/01/24.  Pt fell morning of 5/16, imaging showed hip dislocation, s/p closed reduction.  Past medical history significant of hyperlipidemia, BPH.    PT Comments  Pt in bed, was able to eat lunch prior to session. Pt able to amb with RW on level surfaces x153ft. He requires minor cues for navigating hallway, increased weight shift. Pt has weakness of RLE and has increased reliance on LLE during Sit to Stand biases trunk position with weight shift. Pt returns to recliner chair in room, reports soreness of the R hip, does not feel like he needs ice for pain. Pt requires continued use of gait belt to assist RLE in bed mobility. Pt resting comfortably in recliner at the conclusion of the session. Based on pt PLOF, level of support at home, and current functional status, he would benefit from skilled PT <3 hours per day at dc.     If plan is discharge home, recommend the following: A little help with bathing/dressing/bathroom;Assistance with cooking/housework;Assist for transportation;Help with stairs or ramp for entrance;A little help with walking and/or transfers   Can travel by private vehicle     Yes  Equipment Recommendations  Rolling walker (2 wheels)    Recommendations for Other Services       Precautions / Restrictions Precautions Precautions: Fall;Posterior Hip Precaution Booklet Issued: Yes (comment) Recall of Precautions/Restrictions: Intact Precaution/Restrictions Comments: fall 03/03/24 Required Braces or Orthoses: Other Brace Other Brace: R hip abduction brace, wear at all times except for bathing/skin checks Restrictions Weight Bearing Restrictions Per Provider Order: Yes RLE Weight Bearing Per Provider Order: Weight bearing as tolerated     Mobility  Bed Mobility Overal  bed mobility: Needs Assistance Bed Mobility: Supine to Sit     Supine to sit: Contact guard     General bed mobility comments: gait belt used by pt to assist RLE out of bed    Transfers Overall transfer level: Needs assistance Equipment used: Rolling walker (2 wheels) Transfers: Sit to/from Stand Sit to Stand: Supervision           General transfer comment: increased reliance on walker to stand    Ambulation/Gait Ambulation/Gait assistance: Contact guard assist Gait Distance (Feet): 130 Feet Assistive device: Rolling walker (2 wheels) Gait Pattern/deviations: Step-through pattern, Decreased stride length, Wide base of support, Antalgic Gait velocity: dec     General Gait Details: pt requires cues for navigation, able to complete directional changes with small, frequent steps,   Stairs             Wheelchair Mobility     Tilt Bed    Modified Rankin (Stroke Patients Only)       Balance Overall balance assessment: Needs assistance, History of Falls Sitting-balance support: Feet supported, No upper extremity supported Sitting balance-Leahy Scale: Good     Standing balance support: During functional activity, Reliant on assistive device for balance, Single extremity supported Standing balance-Leahy Scale: Poor Standing balance comment: requires constant UE support for balance, able to remove one hand at a time                            Communication Communication Communication: Impaired Factors Affecting Communication: Hearing impaired  Cognition Arousal: Alert Behavior During Therapy: [facility] for tasks assessed/performed  PT - Cognitive impairments: No apparent impairments                         Following commands: Intact      Cueing    Exercises      General Comments General comments (skin integrity, edema, etc.): KI dc, hip abd brace on at all times      Pertinent Vitals/Pain Pain Assessment Pain Assessment:  Faces Faces Pain Scale: Hurts a little bit Pain Location: R hip Pain Descriptors / Indicators: Sore, Aching Pain Intervention(s): Monitored during session    Home Living                          Prior Function            PT Goals (current goals can now be found in the care plan section) Acute Rehab PT Goals Patient Stated Goal: DC home PT Goal Formulation: With patient Time For Goal Achievement: 03/09/24 Potential to Achieve Goals: Good Progress towards PT goals: Progressing toward goals    Frequency    Min 6X/week      PT Plan      Co-evaluation              AM-PAC PT "6 Clicks" Mobility   Outcome Measure  Help needed turning from your back to your side while in a flat bed without using bedrails?: None Help needed moving from lying on your back to sitting on the side of a flat bed without using bedrails?: A Little Help needed moving to and from a bed to a chair (including a wheelchair)?: A Little Help needed standing up from a chair using your arms (e.g., wheelchair or bedside chair)?: A Little Help needed to walk in hospital room?: A Little Help needed climbing 3-5 steps with a railing? : A Little 6 Click Score: 19    End of Session Equipment Utilized During Treatment: Gait belt Activity Tolerance: Patient tolerated treatment well Patient left: with call bell/phone within reach;in chair;with chair alarm set;with family/visitor present Nurse Communication: Mobility status PT Visit Diagnosis: Difficulty in walking, not elsewhere classified (R26.2);Pain;History of falling (Z91.81) Pain - Right/Left: Right Pain - part of body: Hip     Time: 1610-9604 PT Time Calculation (min) (ACUTE ONLY): 16 min  Charges:    $Gait Training: 8-22 mins PT General Charges $$ ACUTE PT VISIT: 1 Visit                     Darien Eden, PT Acute Rehabilitation Services Office: [phone] 03/06/2024    Serafin Dames 03/06/2024, 1:49 PM

## 2024-03-06 NOTE — Plan of Care (Signed)
  Problem: Health Behavior/Discharge Planning: Goal: Ability to manage health-related needs will improve Outcome: Progressing   Problem: Clinical Measurements: Goal: Ability to maintain clinical measurements within normal limits will improve Outcome: Progressing Goal: Will remain free from infection Outcome: Progressing Goal: Diagnostic test results will improve Outcome: Progressing Goal: Respiratory complications will improve Outcome: Progressing Goal: Cardiovascular complication will be avoided Outcome: Progressing   Problem: Activity: Goal: Risk for activity intolerance will decrease Outcome: Progressing   Problem: Nutrition: Goal: Adequate nutrition will be maintained Outcome: Progressing   Problem: Coping: Goal: Level of anxiety will decrease Outcome: Progressing   Problem: Elimination: Goal: Will not experience complications related to bowel motility Outcome: Progressing   Problem: Pain Managment: Goal: General experience of comfort will improve and/or be controlled Outcome: Progressing   Problem: Safety: Goal: Ability to remain free from injury will improve Outcome: Progressing   Problem: Skin Integrity: Goal: Risk for impaired skin integrity will decrease Outcome: Progressing   Problem: Education: Goal: Verbalization of understanding the information provided (i.e., activity precautions, restrictions, etc) will improve Outcome: Progressing Goal: Individualized Educational Video(s) Outcome: Progressing   Problem: Activity: Goal: Ability to ambulate and perform ADLs will improve Outcome: Progressing   Problem: Clinical Measurements: Goal: Postoperative complications will be avoided or minimized Outcome: Progressing   Problem: Self-Concept: Goal: Ability to maintain and perform role responsibilities to the fullest extent possible will improve Outcome: Progressing   Problem: Pain Management: Goal: Pain level will decrease Outcome: Progressing    Problem: Education: Goal: Knowledge of the prescribed therapeutic regimen will improve Outcome: Progressing Goal: Understanding of discharge needs will improve Outcome: Progressing Goal: Individualized Educational Video(s) Outcome: Progressing   Problem: Activity: Goal: Ability to avoid complications of mobility impairment will improve Outcome: Progressing Goal: Ability to tolerate increased activity will improve Outcome: Progressing   Problem: Clinical Measurements: Goal: Postoperative complications will be avoided or minimized Outcome: Progressing   Problem: Pain Management: Goal: Pain level will decrease with appropriate interventions Outcome: Progressing   Problem: Skin Integrity: Goal: Will show signs of wound healing Outcome: Progressing

## 2024-03-06 NOTE — Progress Notes (Signed)
    Subjective:  Patient reports pain as mild.  Denies N/V/CP/SOB/Abd pain. He reports he is doing well. He has on hip abduction brace and KI. KI removed since he has hip abduction brace.   Objective:   VITALS:   Vitals:   03/05/24 0540 03/05/24 1329 03/05/24 2202 03/06/24 0556  BP: (!) 161/65 (!) 156/72 (!) 145/68 (!) 170/72  Pulse: (!) 57 71 63 70  Resp: 16 19 18 16   Temp: 98.1 F (36.7 C) 97.9 F (36.6 C) 97.8 F (36.6 C) 97.8 F (36.6 C)  TempSrc: Oral Oral Oral Oral  SpO2: 92% 96% 93% 93%  Weight:      Height:        NAD Neurologically intact ABD soft Neurovascular intact Sensation intact distally Intact pulses distally Dorsiflexion/Plantar flexion intact Incision: dressing C/D/I Hip abduction brace in place. KI removed.   Lab Results  Component Value Date   WBC 12.3 (H) 03/01/2024   HGB 13.3 03/01/2024   HCT 41.5 03/01/2024   MCV 98.1 03/01/2024   PLT 170 03/01/2024   BMET    Component Value Date/Time   NA 137 03/01/2024 0331   K 4.0 03/01/2024 0331   CL 105 03/01/2024 0331   CO2 22 03/01/2024 0331   GLUCOSE 123 (H) 03/01/2024 0331   BUN 28 (H) 03/01/2024 0331   CREATININE 0.99 03/01/2024 0331   CALCIUM  8.6 (L) 03/01/2024 0331   GFRNONAA >60 03/01/2024 0331     Assessment/Plan: 3 Days Post-Op   Principal Problem:   Closed subcapital fracture of femur (HCC)   WBAT with walker.  Maintain hip abduction brace and posterior hip precautions.  DVT ppx: Aspirin , SCDs, TEDS PO pain control PT/OT: He ambulated 130 feet with PT yesterday.  Dispo:  - He may weight-bear as tolerated with a walker in a hip abduction brace.  He is to wear the brace at all times except for daily skin checks and hygiene for 6 weeks.   - Posterior hip precautions.   - Patient under care of the medical team, disposition per their recommendation. Looks like d/c to SNF. Pain medication printed in chart.    Harman Lightning 03/06/2024, 8:01 AM   [facility]  [facility] [address], [place], [place] [address] Phone: [phone] [email or web address] Facebook  [employer]

## 2024-03-06 NOTE — TOC Progression Note (Addendum)
 Transition of Care [facility]) - Progression Note    Patient Details  Name: Dennis Osborn MRN: [id number] Date of Birth: June 22, 1941  [facility] CM/SW Contact  Bari Leys, RN Phone Number: 03/06/2024, 12:01 PM  Clinical Narrative:   Call received from [facility], request for Peer to Peer, [phone], option 5, due by 4:00pm EST today, attending notified, await determination.   -2:15pm Call received from Megan, [facility], confirmed Peer to Peer completed, SNF request has been denied,  Medical Director states care can be provided at a lower level of care. Appeal information provided: Phone: [phone], Fax [phone], team notified.     Expected Discharge Plan: Skilled Nursing Facility Barriers to Discharge: Continued Medical Work up  Expected Discharge Plan and Services In-house Referral: Clinical Social Work     Living arrangements for the past 2 months: Single Family Home                                       Social Determinants of Health (SDOH) Interventions SDOH Screenings   Food Insecurity: No Food Insecurity (02/29/2024)  Housing: Low Risk  (02/29/2024)  Transportation Needs: No Transportation Needs (02/29/2024)  Utilities: Not At Risk (02/29/2024)  Depression (PHQ2-9): Low Risk  (01/18/2024)  Social Connections: Unknown (02/27/2022)   Received from [facility]  Tobacco Use: Low Risk  (03/03/2024)    Readmission Risk Interventions    03/01/2024   12:26 PM  Readmission Risk Prevention Plan  Post Dischage Appt Complete  Medication Screening Complete  Transportation Screening Complete

## 2024-03-06 NOTE — TOC Progression Note (Signed)
 Transition of Care [facility]) - Progression Note    Patient Details  Name: Dennis Osborn MRN: [id number] Date of Birth: [birth year]  Transition of Care [facility]) CM/SW Contact  Delilah Fend, LCSW Phone Number: 03/06/2024, 2:37 PM  Clinical Narrative:     Insurance has tentatively denied SNF coverage and peer to peer offered.  MD completed peer call and denial upheld.  Offer made to pt/family to appeal decision and they have chosen to do this.  Have provided pt and sister with # to call to make appeal and they are working on this now.  Await decision on that appeal.    Expected Discharge Plan: Skilled Nursing Facility Barriers to Discharge: Continued Medical Work up  Expected Discharge Plan and Services In-house Referral: Clinical Social Work     Living arrangements for the past 2 months: Single Family Home                                       Social Determinants of Health (SDOH) Interventions SDOH Screenings   Food Insecurity: No Food Insecurity (02/29/2024)  Housing: Low Risk  (02/29/2024)  Transportation Needs: No Transportation Needs (02/29/2024)  Utilities: Not At Risk (02/29/2024)  Depression (PHQ2-9): Low Risk  (01/18/2024)  Social Connections: Unknown (02/27/2022)   Received from [facility]  Tobacco Use: Low Risk  (03/03/2024)    Readmission Risk Interventions    03/01/2024   12:26 PM  Readmission Risk Prevention Plan  Post Dischage Appt Complete  Medication Screening Complete  Transportation Screening Complete

## 2024-03-06 NOTE — Progress Notes (Signed)
 PROGRESS NOTE    Dennis Osborn  WJX:[id number] DOB: 1941/09/30 DOA: 02/29/2024 PCP: Abram Abraham, NP-C   Brief Narrative:  83 y.o. male with medical history significant of hyperlipidemia, BPH presented with a fall and was found to have acute right subcapital femoral neck fracture.  Orthopedics was consulted.  He underwent surgical intervention by orthopedics on 03/01/2024.  PT now recommending SNF.  TOC consulted. Patient fell on the morning of 03/03/24: was found to have periprosthetic hip dislocation requiring surgical intervention again.  Currently medically stable for discharge. Assessment & Plan:   Acute right subcapital femoral neck fracture after a mechanical fall - Fall precautions.  Pain management.   - Status post surgical intervention by orthopedics on 03/01/2024.  Wound care/activity/DVT prophylaxis/pain management as per orthopedics recommendations. -PT recommending SNF.  TOC consulted.  Mechanical fall with periprosthetic hip dislocation  - S/P surgical intervention on 03/03/24  Leukocytosis - Reactive.  Monitor intermittently.  No labs today.  Acute metabolic acidosis - Improved  Hyperlipidemia - Continue statin   DVT prophylaxis: Aspirin  as per orthopedics  code Status: Full Family Communication: Daughter at bedside on 03/05/2024 Disposition Plan: Status is: Inpatient Remains inpatient appropriate because: Of severity of illness.  Need for SNF placement.    Consultants: Orthopedics  Procedures: As above  Antimicrobials: Perioperative   Subjective: Patient seen and examined at bedside.  No fever, vomiting, abdominal pain reported.   Objective: Vitals:   03/05/24 0540 03/05/24 1329 03/05/24 2202 03/06/24 0556  BP: (!) 161/65 (!) 156/72 (!) 145/68 (!) 170/72  Pulse: (!) 57 71 63 70  Resp: 16 19 18 16   Temp: 98.1 F (36.7 C) 97.9 F (36.6 C) 97.8 F (36.6 C) 97.8 F (36.6 C)  TempSrc: Oral Oral Oral Oral  SpO2: 92% 96% 93% 93%  Weight:      Height:         Intake/Output Summary (Last 24 hours) at 03/06/2024 0744 Last data filed at 03/06/2024 0600 Gross per 24 hour  Intake 840 ml  Output 1100 ml  Net -260 ml   Filed Weights   02/29/24 1031 03/01/24 1313 03/03/24 1538  Weight: 72.6 kg 72.6 kg 72.6 kg    Examination:  General: On room air currently.  No distress. respiratory: Decreased breath sounds at bases bilaterally, no wheezing  CVS: Rate mostly controlled; S1 and S2 heard  abdominal: Soft, nontender, distended slightly; no organomegaly;  bowel sounds are heard normally extremities: No clubbing; trace lower extremity edema present   Data Reviewed: I have personally reviewed following labs and imaging studies  CBC: Recent Labs  Lab 02/29/24 1119 03/01/24 0331  WBC 15.6* 12.3*  NEUTROABS 13.4*  --   HGB 14.7 13.3  HCT 43.4 41.5  MCV 93.5 98.1  PLT 246 170   Basic Metabolic Panel: Recent Labs  Lab 02/29/24 1119 03/01/24 0331  NA 139 137  K 4.0 4.0  CL 105 105  CO2 21* 22  GLUCOSE 111* 123*  BUN 18 28*  CREATININE 1.12 0.99  CALCIUM  9.2 8.6*   GFR: Estimated Creatinine Clearance: 53.7 mL/min (by C-G formula based on SCr of 0.99 mg/dL). Liver Function Tests: No results for input(s): "AST", "ALT", "ALKPHOS", "BILITOT", "PROT", "ALBUMIN" in the last 168 hours. No results for input(s): "LIPASE", "AMYLASE" in the last 168 hours. No results for input(s): "AMMONIA" in the last 168 hours. Coagulation Profile: Recent Labs  Lab 02/29/24 1119  INR 1.0   Cardiac Enzymes: No results for input(s): "CKTOTAL", "CKMB", "CKMBINDEX", "  TROPONINI" in the last 168 hours. BNP (last 3 results) No results for input(s): "PROBNP" in the last 8760 hours. HbA1C: No results for input(s): "HGBA1C" in the last 72 hours. CBG: No results for input(s): "GLUCAP" in the last 168 hours. Lipid Profile: No results for input(s): "CHOL", "HDL", "LDLCALC", "TRIG", "CHOLHDL", "LDLDIRECT" in the last 72 hours. Thyroid Function  Tests: No results for input(s): "TSH", "T4TOTAL", "FREET4", "T3FREE", "THYROIDAB" in the last 72 hours. Anemia Panel: No results for input(s): "VITAMINB12", "FOLATE", "FERRITIN", "TIBC", "IRON", "RETICCTPCT" in the last 72 hours. Sepsis Labs: No results for input(s): "PROCALCITON", "LATICACIDVEN" in the last 168 hours.  Recent Results (from the past 240 hours)  Surgical PCR screen     Status: None   Collection Time: 02/29/24 10:10 PM   Specimen: Nasal Mucosa; Nasal Swab  Result Value Ref Range Status   MRSA, PCR NEGATIVE NEGATIVE Final   Staphylococcus aureus NEGATIVE NEGATIVE Final    Comment: (NOTE) The Xpert SA Assay (FDA approved for NASAL specimens in patients 1 years of age and older), is one component of a comprehensive surveillance program. It is not intended to diagnose infection nor to guide or monitor treatment. Performed at [facility], [address] [place], [place] [address]          Radiology Studies: No results found.       Scheduled Meds:  aspirin   81 mg Oral BID   docusate sodium   100 mg Oral BID   pantoprazole   40 mg Oral Daily   rosuvastatin   10 mg Oral QHS   senna  1 tablet Oral BID   Continuous Infusions:          Audria Leather, MD [facility] 03/06/2024, 7:44 AM

## 2024-03-07 DIAGNOSIS — S72011A Unspecified intracapsular fracture of right femur, initial encounter for closed fracture: Secondary | ICD-10-CM | POA: Diagnosis not present

## 2024-03-07 NOTE — Plan of Care (Signed)
  Problem: Health Behavior/Discharge Planning: Goal: Ability to manage health-related needs will improve Outcome: Progressing   Problem: Clinical Measurements: Goal: Ability to maintain clinical measurements within normal limits will improve Outcome: Progressing Goal: Will remain free from infection Outcome: Progressing Goal: Diagnostic test results will improve Outcome: Progressing Goal: Respiratory complications will improve Outcome: Progressing Goal: Cardiovascular complication will be avoided Outcome: Progressing   Problem: Activity: Goal: Risk for activity intolerance will decrease Outcome: Progressing   Problem: Nutrition: Goal: Adequate nutrition will be maintained Outcome: Progressing   Problem: Coping: Goal: Level of anxiety will decrease Outcome: Progressing   Problem: Elimination: Goal: Will not experience complications related to bowel motility Outcome: Progressing   Problem: Pain Managment: Goal: General experience of comfort will improve and/or be controlled Outcome: Progressing   Problem: Safety: Goal: Ability to remain free from injury will improve Outcome: Progressing   Problem: Skin Integrity: Goal: Risk for impaired skin integrity will decrease Outcome: Progressing   Problem: Education: Goal: Verbalization of understanding the information provided (i.e., activity precautions, restrictions, etc) will improve Outcome: Progressing Goal: Individualized Educational Video(s) Outcome: Progressing   Problem: Activity: Goal: Ability to ambulate and perform ADLs will improve Outcome: Progressing   Problem: Clinical Measurements: Goal: Postoperative complications will be avoided or minimized Outcome: Progressing   Problem: Self-Concept: Goal: Ability to maintain and perform role responsibilities to the fullest extent possible will improve Outcome: Progressing   Problem: Pain Management: Goal: Pain level will decrease Outcome: Progressing    Problem: Education: Goal: Knowledge of the prescribed therapeutic regimen will improve Outcome: Progressing Goal: Understanding of discharge needs will improve Outcome: Progressing Goal: Individualized Educational Video(s) Outcome: Progressing   Problem: Activity: Goal: Ability to avoid complications of mobility impairment will improve Outcome: Progressing Goal: Ability to tolerate increased activity will improve Outcome: Progressing   Problem: Clinical Measurements: Goal: Postoperative complications will be avoided or minimized Outcome: Progressing   Problem: Pain Management: Goal: Pain level will decrease with appropriate interventions Outcome: Progressing   Problem: Skin Integrity: Goal: Will show signs of wound healing Outcome: Progressing

## 2024-03-07 NOTE — Progress Notes (Signed)
 PROGRESS NOTE    Dennis Osborn  WUJ:[id number] DOB: [birth year] DOA: 02/29/2024 PCP: Abram Abraham, NP-C   Brief Narrative:  83 y.o. male with medical history significant of hyperlipidemia, BPH presented with a fall and was found to have acute right subcapital femoral neck fracture.  Orthopedics was consulted.  He underwent surgical intervention by orthopedics on 03/01/2024.  PT now recommending SNF.  TOC consulted. Patient fell on the morning of 03/03/24: was found to have periprosthetic hip dislocation requiring surgical intervention again.  Currently medically stable for discharge.  Insurance denied SNF placement on 03/06/2024; patient and family have appealed.  Assessment & Plan:   Acute right subcapital femoral neck fracture after a mechanical fall - Fall precautions.  Pain management.   - Status post surgical intervention by orthopedics on 03/01/2024.  Wound care/activity/DVT prophylaxis/pain management as per orthopedics recommendations. -PT recommending SNF.  Currently medically stable for discharge.  Insurance denied SNF placement on 03/06/2024; patient and family have appealed.  Mechanical fall with periprosthetic hip dislocation  - S/P surgical intervention on 03/03/24  Leukocytosis - Reactive.  Monitor intermittently.  No labs today.  Acute metabolic acidosis - Improved  Hyperlipidemia - Continue statin   DVT prophylaxis: Aspirin  as per orthopedics  code Status: Full Family Communication: Daughter at bedside on 03/05/2024.  None at bedside today Disposition Plan: Status is: Inpatient Remains inpatient appropriate because: Pending disposition home versus SNF.  Patient/family appeal pending.    Consultants: Orthopedics  Procedures: As above  Antimicrobials: Perioperative   Subjective: Patient seen and examined at bedside.  Continues to have intermittent right hip pain.  No abdominal pain, vomiting reported.   Objective: Vitals:   03/06/24 0556 03/06/24 1418  03/06/24 2016 03/07/24 0543  BP: (!) 170/72 (!) 151/64 (!) 154/80 (!) 166/68  Pulse: 70 80 72 62  Resp: 16 17 16 15   Temp: 97.8 F (36.6 C) 98 F (36.7 C) 98.1 F (36.7 C) 97.9 F (36.6 C)  TempSrc: Oral  Oral Oral  SpO2: 93% 93% 95% 93%  Weight:      Height:        Intake/Output Summary (Last 24 hours) at 03/07/2024 0736 Last data filed at 03/07/2024 0544 Gross per 24 hour  Intake 340 ml  Output 1650 ml  Net -1310 ml   Filed Weights   02/29/24 1031 03/01/24 1313 03/03/24 1538  Weight: 72.6 kg 72.6 kg 72.6 kg    Examination:  General: No acute distress.  Remains on room air.   Respiratory: Bilateral decreased breath sounds at bases with scattered rales CVS: S1-S2 heard; rate currently controlled  abdominal: Soft, nontender, mildly distended; no organomegaly; normal bowel sounds heard extremities: Mild lower extremity edema present; no cyanosis  Data Reviewed: I have personally reviewed following labs and imaging studies  CBC: Recent Labs  Lab 02/29/24 1119 03/01/24 0331  WBC 15.6* 12.3*  NEUTROABS 13.4*  --   HGB 14.7 13.3  HCT 43.4 41.5  MCV 93.5 98.1  PLT 246 170   Basic Metabolic Panel: Recent Labs  Lab 02/29/24 1119 03/01/24 0331  NA 139 137  K 4.0 4.0  CL 105 105  CO2 21* 22  GLUCOSE 111* 123*  BUN 18 28*  CREATININE 1.12 0.99  CALCIUM  9.2 8.6*   GFR: Estimated Creatinine Clearance: 53.7 mL/min (by C-G formula based on SCr of 0.99 mg/dL). Liver Function Tests: No results for input(s): "AST", "ALT", "ALKPHOS", "BILITOT", "PROT", "ALBUMIN" in the last 168 hours. No results for input(s): "LIPASE", "AMYLASE"  in the last 168 hours. No results for input(s): "AMMONIA" in the last 168 hours. Coagulation Profile: Recent Labs  Lab 02/29/24 1119  INR 1.0   Cardiac Enzymes: No results for input(s): "CKTOTAL", "CKMB", "CKMBINDEX", "TROPONINI" in the last 168 hours. BNP (last 3 results) No results for input(s): "PROBNP" in the last 8760  hours. HbA1C: No results for input(s): "HGBA1C" in the last 72 hours. CBG: No results for input(s): "GLUCAP" in the last 168 hours. Lipid Profile: No results for input(s): "CHOL", "HDL", "LDLCALC", "TRIG", "CHOLHDL", "LDLDIRECT" in the last 72 hours. Thyroid Function Tests: No results for input(s): "TSH", "T4TOTAL", "FREET4", "T3FREE", "THYROIDAB" in the last 72 hours. Anemia Panel: No results for input(s): "VITAMINB12", "FOLATE", "FERRITIN", "TIBC", "IRON", "RETICCTPCT" in the last 72 hours. Sepsis Labs: No results for input(s): "PROCALCITON", "LATICACIDVEN" in the last 168 hours.  Recent Results (from the past 240 hours)  Surgical PCR screen     Status: None   Collection Time: 02/29/24 10:10 PM   Specimen: Nasal Mucosa; Nasal Swab  Result Value Ref Range Status   MRSA, PCR NEGATIVE NEGATIVE Final   Staphylococcus aureus NEGATIVE NEGATIVE Final    Comment: (NOTE) The Xpert SA Assay (FDA approved for NASAL specimens in patients 22 years of age and older), is one component of a comprehensive surveillance program. It is not intended to diagnose infection nor to guide or monitor treatment. Performed at [facility], [address], [place], [place] [address]          Radiology Studies: No results found.       Scheduled Meds:  aspirin   81 mg Oral BID   docusate sodium   100 mg Oral BID   pantoprazole   40 mg Oral Daily   rosuvastatin   10 mg Oral QHS   senna  1 tablet Oral BID   Continuous Infusions:          Audria Leather, MD [facility] 03/07/2024, 7:36 AM

## 2024-03-07 NOTE — Plan of Care (Signed)

## 2024-03-07 NOTE — Progress Notes (Signed)
 Physical Therapy Treatment Patient Details Name: Dennis Osborn MRN: [id number] DOB: [birth year] Today's Date: 03/07/2024   History of Present Illness 83 y.o. male admitted with R hip fx 02/29/24, s/p THA-AA 03/01/24.  Pt fell morning of 5/16, imaging showed hip dislocation, s/p closed reduction.  Past medical history significant of hyperlipidemia, BPH.    PT Comments  Pt admitted with above diagnosis.  Pt currently with functional limitations due to the deficits listed below (see PT Problem List). Pt in bed when PT arrived. Pt agreeable to therapy intervention, sister present. Pt encouraged to don hearing aids for improved communication. Pt unable to relay posterior hip precautions, reviewed, HO provided and pt later able to recall 1/3. Pt will benefit from reinforcement for recall and observation of R posterior hip precautions and R hip abductor wear schedule. Sister indicated pts home is not conducive to amb with RW and does not feel pt is safe to return home alone at this time. Pt reported no steps to enter home and then one step to enter home, sister indicated 3 steps in front of home and 4 steps from car port with an additional single step to enter, pt has garden tub with a shower and is unable to manage RW in bathroom to the commode. Pt will benefit from AE and OT in addition to ongoing PT intervention to maximize safety and IND in home setting with pt and family reporting issues with transitioning to SNF for short term rehab at this time. Pt is S for bed mobility with cues for hip precautions, S for sit to stand  from EOB, gait tasks 150 x 2 with RW, step almost through pattern with slightly decreased stance time on R with hip abd brace donned and close S, step navigation with B handrail and strong cues for safety and sequencing for step to pattern. Pt guided though LE seated TE with cues for slow and controlled movements. Pt left seated in recliner and all needs in place. PT continues to recommend  inpatient follow up therapy, <3 hours/day.   Pt will benefit from acute skilled PT to increase their independence and safety with mobility to allow discharge.       If plan is discharge home, recommend the following: A little help with bathing/dressing/bathroom;Assistance with cooking/housework;Assist for transportation;Help with stairs or ramp for entrance;A little help with walking and/or transfers   Can travel by private vehicle     Yes  Equipment Recommendations  Rolling walker (2 wheels)    Recommendations for Other Services       Precautions / Restrictions Precautions Precautions: Fall;Posterior Hip Precaution Booklet Issued: Yes (comment) Recall of Precautions/Restrictions: Intact Precaution/Restrictions Comments: fall 03/03/24 Required Braces or Orthoses: Other Brace Other Brace: R hip abduction brace, wear at all times except for bathing/skin checks Restrictions Weight Bearing Restrictions Per Provider Order: No RLE Weight Bearing Per Provider Order: Weight bearing as tolerated     Mobility  Bed Mobility Overal bed mobility: Needs Assistance Bed Mobility: Supine to Sit     Supine to sit: Supervision     General bed mobility comments: to L side of bed to maintain R hip precuations, hip abductor brace donned    Transfers Overall transfer level: Needs assistance Equipment used: Rolling walker (2 wheels) Transfers: Sit to/from Stand Sit to Stand: Supervision           General transfer comment: min cues    Ambulation/Gait Ambulation/Gait assistance: Contact guard assist, Supervision Gait Distance (Feet): 150 Feet Assistive  device: Rolling walker (2 wheels) Gait Pattern/deviations: Wide base of support, Antalgic, Step-through pattern, Decreased stride length, Decreased stance time - right Gait velocity: decreased     General Gait Details: cues for safety and proper AD placement and posture   Stairs Stairs: Yes Stairs assistance: Contact guard  assist Stair Management: Two rails Number of Stairs: 3 General stair comments: cues for safety and sequencing with step to pattern   Wheelchair Mobility     Tilt Bed    Modified Rankin (Stroke Patients Only)       Balance Overall balance assessment: Needs assistance, History of Falls Sitting-balance support: Feet supported, No upper extremity supported Sitting balance-Leahy Scale: Good     Standing balance support: During functional activity, Reliant on assistive device for balance, Single extremity supported Standing balance-Leahy Scale: Poor Standing balance comment: 1 UE support for stabiltiy                            Communication Communication Communication: Impaired Factors Affecting Communication: Hearing impaired  Cognition Arousal: Alert Behavior During Therapy: WFL for tasks assessed/performed   PT - Cognitive impairments: No apparent impairments                         Following commands: Intact      Cueing    Exercises Total Joint Exercises Ankle Circles/Pumps: AROM, Both, 10 reps, Seated Quad Sets: AROM, Both, 5 reps, Supine (recliner) Long Arc Quad: AROM, Right, 5 reps, Seated    General Comments        Pertinent Vitals/Pain Pain Assessment Pain Assessment: No/denies pain Pain Descriptors / Indicators: Sore    Home Living                          Prior Function            PT Goals (current goals can now be found in the care plan section) Acute Rehab PT Goals Patient Stated Goal: DC home PT Goal Formulation: With patient Time For Goal Achievement: 03/09/24 Potential to Achieve Goals: Good Progress towards PT goals: Progressing toward goals    Frequency    Min 6X/week      PT Plan      Co-evaluation              AM-PAC PT "6 Clicks" Mobility   Outcome Measure  Help needed turning from your back to your side while in a flat bed without using bedrails?: None Help needed moving from  lying on your back to sitting on the side of a flat bed without using bedrails?: A Little Help needed moving to and from a bed to a chair (including a wheelchair)?: A Little Help needed standing up from a chair using your arms (e.g., wheelchair or bedside chair)?: A Little Help needed to walk in hospital room?: A Little Help needed climbing 3-5 steps with a railing? : A Little 6 Click Score: 19    End of Session Equipment Utilized During Treatment: Gait belt Activity Tolerance: Patient tolerated treatment well Patient left: with call bell/phone within reach;in chair;with chair alarm set;with family/visitor present Nurse Communication: Mobility status PT Visit Diagnosis: Difficulty in walking, not elsewhere classified (R26.2);Pain;History of falling (Z91.81) Pain - Right/Left: Right Pain - part of body: Hip     Time: 8413-2440 PT Time Calculation (min) (ACUTE ONLY): 45 min  Charges:    $Gait  Training: 8-22 mins $Therapeutic Exercise: 8-22 mins $Therapeutic Activity: 8-22 mins PT General Charges $$ ACUTE PT VISIT: 1 Visit                     Cary Clarks, PT Acute Rehab    Annalee Kiang 03/07/2024, 5:03 PM

## 2024-03-08 DIAGNOSIS — S73004A Unspecified dislocation of right hip, initial encounter: Secondary | ICD-10-CM | POA: Diagnosis not present

## 2024-03-08 DIAGNOSIS — S73004D Unspecified dislocation of right hip, subsequent encounter: Secondary | ICD-10-CM | POA: Diagnosis not present

## 2024-03-08 DIAGNOSIS — E872 Acidosis, unspecified: Secondary | ICD-10-CM | POA: Diagnosis not present

## 2024-03-08 DIAGNOSIS — E785 Hyperlipidemia, unspecified: Secondary | ICD-10-CM | POA: Diagnosis present

## 2024-03-08 DIAGNOSIS — D72829 Elevated white blood cell count, unspecified: Secondary | ICD-10-CM | POA: Diagnosis not present

## 2024-03-08 DIAGNOSIS — S72011P Unspecified intracapsular fracture of right femur, subsequent encounter for closed fracture with malunion: Secondary | ICD-10-CM

## 2024-03-08 MED ORDER — VITAMIN D 25 MCG (1000 UNIT) PO TABS
1000.0000 [IU] | ORAL_TABLET | Freq: Every day | ORAL | Status: DC
  Administered 2024-03-08 – 2024-03-10 (×3): 1000 [IU] via ORAL
  Filled 2024-03-08 (×3): qty 1

## 2024-03-08 MED ORDER — ESCITALOPRAM OXALATE 10 MG PO TABS
5.0000 mg | ORAL_TABLET | Freq: Every day | ORAL | Status: DC
  Administered 2024-03-08 – 2024-03-10 (×3): 5 mg via ORAL
  Filled 2024-03-08 (×3): qty 1

## 2024-03-08 MED ORDER — ADULT MULTIVITAMIN W/MINERALS CH
1.0000 | ORAL_TABLET | Freq: Every day | ORAL | Status: DC
  Administered 2024-03-08 – 2024-03-10 (×3): 1 via ORAL
  Filled 2024-03-08 (×3): qty 1

## 2024-03-08 MED ORDER — DONEPEZIL HCL 10 MG PO TABS
10.0000 mg | ORAL_TABLET | Freq: Every day | ORAL | Status: DC
  Administered 2024-03-08 – 2024-03-10 (×3): 10 mg via ORAL
  Filled 2024-03-08 (×3): qty 1

## 2024-03-08 NOTE — Progress Notes (Signed)
 PROGRESS NOTE    Dennis Osborn  BJY:[id number] DOB: 1941/09/04 DOA: 02/29/2024 PCP: Abram Abraham, NP-C    Chief Complaint  Patient presents with   Fall    Brief Narrative:  83 y.o. male with medical history significant of hyperlipidemia, BPH presented with a fall and was found to have acute right subcapital femoral neck fracture.  Orthopedics was consulted.  He underwent surgical intervention by orthopedics on 03/01/2024.  PT now recommending SNF.  TOC consulted. Patient fell on the morning of 03/03/24: was found to have periprosthetic hip dislocation requiring surgical intervention again.  Currently medically stable for discharge.  Insurance denied SNF placement on 03/06/2024; patient and family have appealed.    Assessment & Plan:   Principal Problem:   Closed subcapital fracture of femur (HCC) Active Problems:   Hip dislocation, right (HCC)   Hyperlipidemia   Leukocytosis   Metabolic acidosis  #1 acute right subcapital femoral neck fracture after mechanical fall -Status post surgical intervention per orthopedics on 03/01/2024. - Patient seen by PT/OT who recommended SNF placement. - Continue current pain management, DVT prophylaxis, wound care per orthopedics. - Patient noted to have been medically stable, insurance denied SNF placement on 03/06/2024 and family have appealed with appeal process in place. - Per orthopedics.  2.  Mechanical fall with periprosthetic hip dislocation -Status post closed reduction right hip per orthopedics, Dr. Charol Copas 03/03/2024. - PT/OT.  3.  Leukocytosis -Felt likely to be reactive and noted to be trending down. - Patient afebrile.  4.  Acute metabolic acidosis -Improved.  5.  Hyperlipidemia -Continue statin.   DVT prophylaxis: Aspirin  Code Status: Full Family Communication: Updated patient, sister at bedside. Disposition: SNF  Status is: Inpatient Remains inpatient appropriate because: Unsafe disposition   Consultants:   Orthopedics: Dr. Charol Copas 02/29/2024  Procedures:  Right total hip arthroplasty, anterior approach per orthopedics: Dr. Charol Copas 03/01/2024 Closed reduction right hip for periprosthetic right hip dislocation per orthopedics: Dr. Charol Copas 03/03/2024 Plain films of the right femur 02/29/2024 Plain films of the right femur 02/29/2024 Plain films of the L-spine 02/29/2024 Plain films of the pelvis 02/29/2024, 03/01/2024, 03/03/2024 Plain films of the right femur 03/03/2024 Plain films of the right hip and pelvis 03/03/2024  Antimicrobials:  Anti-infectives (From admission, onward)    Start     Dose/Rate Route Frequency Ordered Stop   03/01/24 2130  ceFAZolin  (ANCEF ) IVPB 2g/100 mL premix        2 g 200 mL/hr over 30 Minutes Intravenous Every 6 hours 03/01/24 1816 03/02/24 0303   03/01/24 1230  ceFAZolin  (ANCEF ) IVPB 2g/100 mL premix        2 g 200 mL/hr over 30 Minutes Intravenous On call to O.R. 03/01/24 1220 03/01/24 1525         Subjective: Patient laying in bed.  Denies any chest pain or shortness of breath.  No abdominal pain.  No right hip pain.  Sister at bedside.  Objective: Vitals:   03/07/24 1420 03/07/24 2205 03/08/24 0617 03/08/24 1420  BP: 124/86 (!) 159/67 (!) 142/66 (!) 145/77  Pulse: 65 70 (!) 59 76  Resp: 18 14 16 16   Temp: 98.3 F (36.8 C) 98.3 F (36.8 C) 97.9 F (36.6 C) 97.8 F (36.6 C)  TempSrc: Oral Oral Oral   SpO2: 97% 94% 96% 98%  Weight:      Height:        Intake/Output Summary (Last 24 hours) at 03/08/2024 1909 Last data filed at 03/08/2024 1300 Gross per 24  hour  Intake 220 ml  Output 675 ml  Net -455 ml   Filed Weights   02/29/24 1031 03/01/24 1313 03/03/24 1538  Weight: 72.6 kg 72.6 kg 72.6 kg    Examination:  General exam: Appears calm and comfortable  Respiratory system: Clear to auscultation.  No wheezes, no crackles, no rhonchi.  Fair air movement.  Speaking in full sentences.  Respiratory effort normal. Cardiovascular system: S1 &  S2 heard, RRR. No JVD, murmurs, rubs, gallops or clicks. No pedal edema. Gastrointestinal system: Abdomen is nondistended, soft and nontender. No organomegaly or masses felt. Normal bowel sounds heard. Central nervous system: Alert and oriented. No focal neurological deficits. Extremities: Right lower extremity with brace on.  Skin: No rashes, lesions or ulcers Psychiatry: Judgement and insight appear normal. Mood & affect appropriate.     Data Reviewed: I have personally reviewed following labs and imaging studies  CBC: No results for input(s): "WBC", "NEUTROABS", "HGB", "HCT", "MCV", "PLT" in the last 168 hours.  Basic Metabolic Panel: No results for input(s): "NA", "K", "CL", "CO2", "GLUCOSE", "BUN", "CREATININE", "CALCIUM ", "MG", "PHOS" in the last 168 hours.  GFR: Estimated Creatinine Clearance: 53.7 mL/min (by C-G formula based on SCr of 0.99 mg/dL).  Liver Function Tests: No results for input(s): "AST", "ALT", "ALKPHOS", "BILITOT", "PROT", "ALBUMIN" in the last 168 hours.  CBG: No results for input(s): "GLUCAP" in the last 168 hours.   Recent Results (from the past 240 hours)  Surgical PCR screen     Status: None   Collection Time: 02/29/24 10:10 PM   Specimen: Nasal Mucosa; Nasal Swab  Result Value Ref Range Status   MRSA, PCR NEGATIVE NEGATIVE Final   Staphylococcus aureus NEGATIVE NEGATIVE Final    Comment: (NOTE) The Xpert SA Assay (FDA approved for NASAL specimens in patients 17 years of age and older), is one component of a comprehensive surveillance program. It is not intended to diagnose infection nor to guide or monitor treatment. Performed at [facility], [address], [place], [place] [address]          Radiology Studies: No results found.      Scheduled Meds:  aspirin   81 mg Oral BID   cholecalciferol  1,000 Units Oral Daily   docusate sodium   100 mg Oral BID   donepezil  10 mg Oral Daily   escitalopram  5 mg Oral  Daily   multivitamin with minerals  1 tablet Oral Daily   pantoprazole   40 mg Oral Daily   rosuvastatin   10 mg Oral QHS   senna  1 tablet Oral BID   Continuous Infusions:   LOS: 8 days    Time spent: 35 minutes    Hilda Lovings, MD [facility]   To contact the attending provider between 7A-7P or the covering provider during after hours 7P-7A, please log into the web site [email or web address] and access using universal [place] password for that web site. If you do not have the password, please call the hospital operator.  03/08/2024, 7:09 PM

## 2024-03-08 NOTE — TOC Progression Note (Signed)
 Transition of Care [facility]) - Progression Note    Patient Details  Name: Dennis Osborn MRN: [id number] Date of Birth: 1940-12-15  [facility]) CM/SW Contact  Delilah Fend, LCSW Phone Number: 03/08/2024, 3:40 PM  Clinical Narrative:     Continue to await decision on family appeal made to insurance for SNF placement.  Expected Discharge Plan: Skilled Nursing Facility Barriers to Discharge: Continued Medical Work up  Expected Discharge Plan and Services In-house Referral: Clinical Social Work     Living arrangements for the past 2 months: Single Family Home                                       Social Determinants of Health (SDOH) Interventions SDOH Screenings   Food Insecurity: No Food Insecurity (02/29/2024)  Housing: Low Risk  (02/29/2024)  Transportation Needs: No Transportation Needs (02/29/2024)  Utilities: Not At Risk (02/29/2024)  Depression (PHQ2-9): Low Risk  (01/18/2024)  Social Connections: Unknown (02/27/2022)   Received from [facility]  Tobacco Use: Low Risk  (03/03/2024)    Readmission Risk Interventions    03/01/2024   12:26 PM  Readmission Risk Prevention Plan  Post Dischage Appt Complete  Medication Screening Complete  Transportation Screening Complete

## 2024-03-08 NOTE — Plan of Care (Signed)
  Problem: Pain Management: Goal: Pain level will decrease Outcome: Progressing   Problem: Safety: Goal: Ability to remain free from injury will improve Outcome: Progressing

## 2024-03-08 NOTE — Progress Notes (Signed)
 Physical Therapy Treatment Patient Details Name: Dennis Osborn MRN: [id number] DOB: 1941/08/15 Today's Date: 03/08/2024   History of Present Illness 83 y.o. male admitted with R hip fx 02/29/24, s/p THA-AA 03/01/24.  Pt fell morning of 5/16, imaging showed hip dislocation, s/p closed reduction.  Past medical history significant of hyperlipidemia, BPH.    PT Comments   Pt admitted with above diagnosis.  Pt currently with functional limitations due to the deficits listed below (see PT Problem List). Pt in bed with R hip abductor brace donned when PT arrived. Pt agreeable to therapy intervention. Pt indicated no pain, CP in place and sister reported pt had pain medication prior to PT arrival. Pt is able to read posterior hip precautions, however pt is demonstrating difficulty with recall and observation and will  benefit from continued reinforcement with pt able to recall 1/3. Pt required S and min cues for supine to sit, S for sit to stand from EOB, gait tasks with S and min cues and step navigation with B handrail x 3 steps with S and cues. Pt participated with LE TE including 5 x STS and standing balance challenges without UE support. Pt will benefit from ongoing therapy services in next venue.  Pt will benefit from acute skilled PT to increase their independence and safety with mobility to allow discharge.      If plan is discharge home, recommend the following: A little help with bathing/dressing/bathroom;Assistance with cooking/housework;Assist for transportation;Help with stairs or ramp for entrance;A little help with walking and/or transfers   Can travel by private vehicle     Yes  Equipment Recommendations  Rolling walker (2 wheels)    Recommendations for Other Services       Precautions / Restrictions Precautions Precautions: Fall;Posterior Hip Precaution Booklet Issued: Yes (comment) Recall of Precautions/Restrictions: Intact Precaution/Restrictions Comments: fall 03/03/24 Required  Braces or Orthoses: Other Brace Other Brace: R hip abduction brace, wear at all times except for bathing/skin checks Restrictions Weight Bearing Restrictions Per Provider Order: No RLE Weight Bearing Per Provider Order: Weight bearing as tolerated     Mobility  Bed Mobility Overal bed mobility: Needs Assistance Bed Mobility: Supine to Sit     Supine to sit: Supervision     General bed mobility comments: to L side of bed to maintain R hip precuations, hip abductor brace donned    Transfers Overall transfer level: Needs assistance Equipment used: Rolling walker (2 wheels) Transfers: Sit to/from Stand Sit to Stand: Supervision           General transfer comment: min cues    Ambulation/Gait Ambulation/Gait assistance: Supervision Gait Distance (Feet): 200 Feet Assistive device: Rolling walker (2 wheels) Gait Pattern/deviations: Wide base of support, Antalgic, Step-through pattern, Decreased stride length, Decreased stance time - right Gait velocity: decreased     General Gait Details: cues for safety and proper AD placement and posture   Stairs   Stairs assistance: Supervision Stair Management: Two rails Number of Stairs: 3 General stair comments: cues for safety and sequencing with step to pattern and pt able to self cue for proper technique   Wheelchair Mobility     Tilt Bed    Modified Rankin (Stroke Patients Only)       Balance Overall balance assessment: Needs assistance, History of Falls Sitting-balance support: Feet supported, No upper extremity supported Sitting balance-Leahy Scale: Good     Standing balance support: During functional activity, Reliant on assistive device for balance, Single extremity supported Standing balance-Leahy Scale:  Fair Standing balance comment: no UE support static standing with no overt LOB pt able to accept moderate external purterbation with wide BOS and reach outside BOS                             Communication Communication Communication: Impaired Factors Affecting Communication: Hearing impaired  Cognition Arousal: Alert Behavior During Therapy: WFL for tasks assessed/performed   PT - Cognitive impairments: No apparent impairments                         Following commands: Intact      Cueing    Exercises Total Joint Exercises Ankle Circles/Pumps: AROM, Both, 10 reps, Seated Quad Sets: AROM, Both, Supine, 10 reps (recliner) Heel Slides: Right, AROM, 5 reps, Seated (recliner, multimodal cues for observing R posterior hip precuations) Long Arc Quad: AROM, Right, Seated, 10 reps    General Comments        Pertinent Vitals/Pain Pain Assessment Pain Assessment: No/denies pain    Home Living                          Prior Function            PT Goals (current goals can now be found in the care plan section) Acute Rehab PT Goals Patient Stated Goal: DC home PT Goal Formulation: With patient Time For Goal Achievement: 03/09/24 Potential to Achieve Goals: Good Progress towards PT goals: Progressing toward goals    Frequency    Min 6X/week      PT Plan      Co-evaluation              AM-PAC PT "6 Clicks" Mobility   Outcome Measure  Help needed turning from your back to your side while in a flat bed without using bedrails?: None Help needed moving from lying on your back to sitting on the side of a flat bed without using bedrails?: None Help needed moving to and from a bed to a chair (including a wheelchair)?: A Little Help needed standing up from a chair using your arms (e.g., wheelchair or bedside chair)?: A Little Help needed to walk in hospital room?: A Little Help needed climbing 3-5 steps with a railing? : A Little 6 Click Score: 20    End of Session Equipment Utilized During Treatment: Gait belt Activity Tolerance: Patient tolerated treatment well Patient left: with call bell/phone within reach;in chair;with chair  alarm set;with family/visitor present Nurse Communication: Mobility status PT Visit Diagnosis: Difficulty in walking, not elsewhere classified (R26.2);Pain;History of falling (Z91.81) Pain - Right/Left: Right Pain - part of body: Hip     Time: 1610-9604 PT Time Calculation (min) (ACUTE ONLY): 27 min  Charges:    $Gait Training: 8-22 mins $Therapeutic Exercise: 8-22 mins PT General Charges $$ ACUTE PT VISIT: 1 Visit                     Cary Clarks, PT Acute Rehab    Annalee Kiang 03/08/2024, 10:59 AM

## 2024-03-08 NOTE — Evaluation (Addendum)
 Occupational Therapy Evaluation Patient Details Name: Dennis Osborn MRN: [id number] DOB: [birth year] Today's Date: 03/08/2024   History of Present Illness   Patient is a 83 year old male who was admitted with R hip fx 02/29/24, s/p THA-AA 03/01/24.  Pt fell morning of 5/16, imaging showed hip dislocation, s/p closed reduction.  Past medical history significant of hyperlipidemia, BPH.     Clinical Impressions Patient is a 83 year old male who was admitted for above. Patient was living at home alone prior level. Currently, patient is supervision for UB ADLs and mod A for LB ADLs with education on AE use to maintain posterior hip precautions. Patient's sister was present in room during session. Patient continues to have poor safety awareness during session and no support at home 24/7 to assist with cues for posterior hip precautions.Patient will benefit from continued inpatient follow up therapy, <3 hours/day      If plan is discharge home, recommend the following:   A little help with walking and/or transfers;A little help with bathing/dressing/bathroom;Assistance with cooking/housework;Direct supervision/assist for medications management;Assist for transportation;Help with stairs or ramp for entrance;Direct supervision/assist for financial management     Functional Status Assessment   Patient has had a recent decline in their functional status and demonstrates the ability to make significant improvements in function in a reasonable and predictable amount of time.     Equipment Recommendations   Other (comment) (total hip kit)      Precautions/Restrictions   Precautions Precautions: Fall;Posterior Hip Precaution Booklet Issued: Yes (comment) Recall of Precautions/Restrictions: Intact Precaution/Restrictions Comments: fall 03/03/24 Required Braces or Orthoses: Other Brace Other Brace: R hip abduction brace, wear at all times except for bathing/skin checks Restrictions Weight  Bearing Restrictions Per Provider Order: No RLE Weight Bearing Per Provider Order: Weight bearing as tolerated     Mobility Bed Mobility Overal bed mobility: Needs Assistance Bed Mobility: Supine to Sit     Supine to sit: Supervision Sit to supine: Supervision   General bed mobility comments: to L side of bed to maintain R hip precuations        Balance Overall balance assessment: Needs assistance, History of Falls Sitting-balance support: Feet supported, No upper extremity supported Sitting balance-Leahy Scale: Good     Standing balance support: During functional activity, Reliant on assistive device for balance, Single extremity supported Standing balance-Leahy Scale: Fair         ADL either performed or assessed with clinical judgement   ADL Overall ADL's : Needs assistance/impaired Eating/Feeding: Modified independent;Sitting   Grooming: Sitting;Supervision/safety   Upper Body Bathing: Sitting;Set up   Lower Body Bathing: Moderate assistance Lower Body Bathing Details (indicate cue type and reason): with education on long handled sponge and total hip kit. sister texted his daughter while session was going on to get these items ordered for patient. Upper Body Dressing : Sitting;Set up   Lower Body Dressing: Moderate assistance Lower Body Dressing Details (indicate cue type and reason): patient has hip abductor brace that can come off for bathing and dressing tasks. patient had no clothing in room. sister to acquire some. patietn was educated on donning/doffing socks with reacher and sock aid with min cues to complete task. patient was educaetd on donning/doffing patient with monidfied methods with pillow case with patient completing with min cues to maintian precuations. Toilet Transfer: Supervision/safety;Rolling walker (2 wheels);Ambulation Toilet Transfer Details (indicate cue type and reason): able to complete half loop around unit with no c/o pain Toileting-  Clothing Manipulation  and Hygiene: Sit to/from stand;Contact guard assist                Pertinent Vitals/Pain Pain Assessment Pain Assessment: No/denies pain     Extremity/Trunk Assessment Upper Extremity Assessment Upper Extremity Assessment: Overall WFL for tasks assessed   Lower Extremity Assessment Lower Extremity Assessment: Defer to PT evaluation   Cervical / Trunk Assessment Cervical / Trunk Assessment: Normal   Communication Communication Communication: Impaired Factors Affecting Communication: Hearing impaired   Cognition Arousal: Alert Behavior During Therapy: WFL for tasks assessed/performed Cognition: Cognition impaired     Awareness: Intellectual awareness impaired, Online awareness impaired Memory impairment (select all impairments): Short-term memory, Working memory Attention impairment (select first level of impairment): Alternating attention Executive functioning impairment (select all impairments): Sequencing, Reasoning, Problem solving OT - Cognition Comments: patient able to remember 2/3 posterior hip precautions during session from PT session.                 Following commands: Intact                  Home Living Family/patient expects to be discharged to:: Private residence Living Arrangements: Alone Available Help at Discharge: Family;Available PRN/intermittently Type of Home: House Home Access: Stairs to enter [employer] of Steps: 3 with 1 rail and sliding door or 1 step without rail in front   Home Layout: One level               Home Equipment: Shower seat   Additional Comments: patients sister was present in room reporting that she could help some but did not want to get stuck providing 24/7 care for patient.      Prior Functioning/Environment Prior Level of Function : Independent/Modified Independent;Driving               ADLs Comments: daughter helps with med management at baseline    OT  Problem List: Decreased cognition;Decreased knowledge of precautions;Decreased safety awareness;Decreased knowledge of use of DME or AE;Decreased activity tolerance   OT Treatment/Interventions: Self-care/ADL training;DME and/or AE instruction;Therapeutic activities;Balance training;Energy conservation;Patient/family education      OT Goals(Current goals can be found in the care plan section)   Acute Rehab OT Goals Patient Stated Goal: to go home OT Goal Formulation: With patient/family Time For Goal Achievement: 03/22/24 Potential to Achieve Goals: Fair ADL Goals Pt Will Perform Lower Body Dressing: with modified independence;with adaptive equipment;sit to/from stand Pt Will Transfer to Toilet: with modified independence;ambulating;regular height toilet Pt Will Perform Toileting - Clothing Manipulation and hygiene: with modified independence;sit to/from stand Additional ADL Goal #1: patient to don/doff hip abductor brace with supervision to be able to return home   OT Frequency:  Min 2X/week       AM-PAC OT "6 Clicks" Daily Activity     Outcome Measure Help from another person eating meals?: None Help from another person taking care of personal grooming?: None Help from another person toileting, which includes using toliet, bedpan, or urinal?: A Little Help from another person bathing (including washing, rinsing, drying)?: A Lot Help from another person to put on and taking off regular upper body clothing?: A Little Help from another person to put on and taking off regular lower body clothing?: A Lot 6 Click Score: 18   End of Session Equipment Utilized During Treatment: Gait belt;Rolling walker (2 wheels);Other (comment) (hip abductor brace) Nurse Communication: Mobility status  Activity Tolerance: Patient tolerated treatment well Patient left: in bed;with call bell/phone within reach;with bed  alarm set  OT Visit Diagnosis: Unsteadiness on feet (R26.81);Other abnormalities of  gait and mobility (R26.89)                Time: 9147-8295 OT Time Calculation (min): 29 min Charges:  OT General Charges $OT Visit: 1 Visit OT Evaluation $OT Eval Moderate Complexity: 1 Mod OT Treatments $Self Care/Home Management : 8-22 mins  Wynette Heckler, MS Acute Rehabilitation Department Office# [phone]   Jame Maze 03/08/2024, 4:56 PM

## 2024-03-09 DIAGNOSIS — S73004D Unspecified dislocation of right hip, subsequent encounter: Secondary | ICD-10-CM | POA: Diagnosis not present

## 2024-03-09 DIAGNOSIS — D72829 Elevated white blood cell count, unspecified: Secondary | ICD-10-CM | POA: Diagnosis not present

## 2024-03-09 DIAGNOSIS — S72011P Unspecified intracapsular fracture of right femur, subsequent encounter for closed fracture with malunion: Secondary | ICD-10-CM | POA: Diagnosis not present

## 2024-03-09 DIAGNOSIS — E872 Acidosis, unspecified: Secondary | ICD-10-CM | POA: Diagnosis not present

## 2024-03-09 LAB — BASIC METABOLIC PANEL WITH GFR
Anion gap: 10 (ref 5–15)
BUN: 25 mg/dL — ABNORMAL HIGH (ref 8–23)
CO2: 25 mmol/L (ref 22–32)
Calcium: 8.5 mg/dL — ABNORMAL LOW (ref 8.9–10.3)
Chloride: 101 mmol/L (ref 98–111)
Creatinine, Ser: 1.03 mg/dL (ref 0.61–1.24)
GFR, Estimated: >60 mL/min (ref >60)
Glucose, Bld: 108 mg/dL — ABNORMAL HIGH (ref 70–99)
Potassium: 4 mmol/L (ref 3.5–5.1)
Sodium: 136 mmol/L (ref 135–145)

## 2024-03-09 LAB — CBC
HCT: 38.9 % — ABNORMAL LOW (ref 39.0–52.0)
Hemoglobin: 12.8 g/dL — ABNORMAL LOW (ref 13.0–17.0)
MCH: 31.7 pg (ref 26.0–34.0)
MCHC: 32.9 g/dL (ref 30.0–36.0)
MCV: 96.3 fL (ref 80.0–100.0)
Platelets: 253 10*3/uL (ref 150–400)
RBC: 4.04 MIL/uL — ABNORMAL LOW (ref 4.22–5.81)
RDW: 13.1 % (ref 11.5–15.5)
WBC: 9.1 10*3/uL (ref 4.0–10.5)
nRBC: 0 % (ref 0.0–0.2)

## 2024-03-09 NOTE — TOC Progression Note (Signed)
 Transition of Care [facility]) - Progression Note    Patient Details  Name: Dennis Osborn MRN: [id number] Date of Birth: [birth year]  [facility]) CM/SW Contact  Delilah Fend, LCSW Phone Number: 03/09/2024, 3:16 PM  Clinical Narrative:     Continue to await decision on family appeal for SNF coverage.   Expected Discharge Plan: Skilled Nursing Facility Barriers to Discharge: Continued Medical Work up  Expected Discharge Plan and Services In-house Referral: Clinical Social Work     Living arrangements for the past 2 months: Single Family Home                                       Social Determinants of Health (SDOH) Interventions SDOH Screenings   Food Insecurity: No Food Insecurity (02/29/2024)  Housing: Low Risk  (02/29/2024)  Transportation Needs: No Transportation Needs (02/29/2024)  Utilities: Not At Risk (02/29/2024)  Depression (PHQ2-9): Low Risk  (01/18/2024)  Social Connections: Unknown (02/27/2022)   Received from [facility]  Tobacco Use: Low Risk  (03/03/2024)    Readmission Risk Interventions    03/01/2024   12:26 PM  Readmission Risk Prevention Plan  Post Dischage Appt Complete  Medication Screening Complete  Transportation Screening Complete

## 2024-03-09 NOTE — Plan of Care (Signed)
   Problem: Clinical Measurements: Goal: Ability to maintain clinical measurements within normal limits will improve Outcome: Progressing

## 2024-03-09 NOTE — Progress Notes (Signed)
 Occupational Therapy Treatment Patient Details Name: MURDOCK JELLISON MRN: [id number] DOB: 1940-10-25 Today's Date: 03/09/2024   History of present illness Patient is a 83 year old male who was admitted with R hip fx 02/29/24, s/p THA-AA 03/01/24.  Pt fell morning of 5/16, imaging showed hip dislocation, s/p closed reduction.  Past medical history significant of hyperlipidemia, BPH.   OT comments  Pt seen for OT tx this date. Pt pleasant, and motivated to participate. Presents with decreased insight into deficits, poor safety awareness, and is unable to recall posterior hip precautions for functional task performance. Good carryover from previous OT session regarding AE use for LB dressing, requires MAX multimodal cuing to implement precautions. MOD A for LB dressing (socks, shorts) and CGA for 40 ft functional mobility using RW in hallway. Pt unsafe to return home alone without 24/7 assist to adhere to posterior hip precautions and is at risk for falls. OT will continue to progress as able, patient will benefit from continued inpatient follow up therapy, <3 hours/day       If plan is discharge home, recommend the following:  A little help with walking and/or transfers;A little help with bathing/dressing/bathroom;Assistance with cooking/housework;Direct supervision/assist for medications management;Assist for transportation;Help with stairs or ramp for entrance;Direct supervision/assist for financial management   Equipment Recommendations  Other (comment) (total hip kit)    Recommendations for Other Services      Precautions / Restrictions Precautions Precautions: Fall;Posterior Hip Precaution Booklet Issued: Yes (comment) Recall of Precautions/Restrictions: Impaired Precaution/Restrictions Comments: fall 03/03/24 Required Braces or Orthoses: Other Brace Other Brace: R hip abduction brace, wear at all times except for bathing/skin checks Restrictions Weight Bearing Restrictions Per Provider  Order: Yes RLE Weight Bearing Per Provider Order: Weight bearing as tolerated       Mobility Bed Mobility Overal bed mobility: Needs Assistance Bed Mobility: Supine to Sit     Supine to sit: Supervision Sit to supine: Supervision        Transfers Overall transfer level: Needs assistance Equipment used: Rolling walker (2 wheels) Transfers: Sit to/from Stand Sit to Stand: Supervision           General transfer comment: min vcs throughout. pt mildly impulsive and requires cues to sequence transfer for balance     Balance Overall balance assessment: Needs assistance, History of Falls Sitting-balance support: Feet supported, No upper extremity supported Sitting balance-Leahy Scale: Good     Standing balance support: During functional activity, Reliant on assistive device for balance, Single extremity supported Standing balance-Leahy Scale: Fair Standing balance comment: able to pull shorts over hips in standing without overt LOB.                           ADL either performed or assessed with clinical judgement   ADL Overall ADL's : Needs assistance/impaired                   Upper Body Dressing Details (indicate cue type and reason): edu on donning shirt before placing R hip brace for protective barrier and for optimal skin integrity Lower Body Dressing: Moderate assistance Lower Body Dressing Details (indicate cue type and reason): requires MAX multimodal cuing to adhere to posterior hip precautions. pt with good recall of AE use. dons and doffs socks + shorts. OT educates on checking for skin integrity and recommendations for clothing layering given hip abduction brace. cues for task segmentation with pt not adhering to cues to redirect for  posterior hip precautions, stern intervention from OT required to redirect with pt's decreased safety awareness             Functional mobility during ADLs: Contact guard assist;Rolling walker (2 wheels);Cueing  for sequencing;Cueing for safety (~40 ft) General ADL Comments: Session focused on AE education for LB ADLs.     Communication Communication Communication: Impaired Factors Affecting Communication: Hearing impaired   Cognition Arousal: Alert Behavior During Therapy: WFL for tasks assessed/performed Cognition: Cognition impaired     Awareness: Intellectual awareness impaired, Online awareness impaired Memory impairment (select all impairments): Short-term memory, Working memory Attention impairment (select first level of impairment): Alternating attention Executive functioning impairment (select all impairments): Sequencing, Reasoning, Problem solving OT - Cognition Comments: pt verbalizes 3/3 hip precautions (using instructions on board), cannot implement functionally, requires MAX mutlimodal cuing and stern direction to adhere to [facility] likely contributing)                 Following commands: Impaired Following commands impaired: Follows one step commands with increased time      Cueing   Cueing Techniques: Verbal cues, Gestural cues, Tactile cues  Exercises      Shoulder Instructions       General Comments R hip abduction brace donned throughout.    Pertinent Vitals/ Pain       Pain Assessment Pain Assessment: No/denies pain Pain Score: 0-No pain         Frequency  Min 2X/week        Progress Toward Goals  OT Goals(current goals can now be found in the care plan section)  Progress towards OT goals: Progressing toward goals  Acute Rehab OT Goals OT Goal Formulation: With patient/family Time For Goal Achievement: 03/22/24 Potential to Achieve Goals: Fair ADL Goals Pt Will Perform Lower Body Dressing: with modified independence;with adaptive equipment;sit to/from stand Pt Will Transfer to Toilet: with modified independence;ambulating;regular height toilet Pt Will Perform Toileting - Clothing Manipulation and hygiene: with modified independence;sit  to/from stand Additional ADL Goal #1: patient to don/doff hip abductor brace with supervision to be able to return home  Plan      Co-evaluation                 AM-PAC OT "6 Clicks" Daily Activity     Outcome Measure   Help from another person eating meals?: None Help from another person taking care of personal grooming?: None Help from another person toileting, which includes using toliet, bedpan, or urinal?: A Little Help from another person bathing (including washing, rinsing, drying)?: A Lot Help from another person to put on and taking off regular upper body clothing?: A Little Help from another person to put on and taking off regular lower body clothing?: A Lot 6 Click Score: 18    End of Session Equipment Utilized During Treatment: Gait belt;Rolling walker (2 wheels);Other (comment) (hip abductor brace)  OT Visit Diagnosis: Unsteadiness on feet (R26.81);Other abnormalities of gait and mobility (R26.89)   Activity Tolerance Patient tolerated treatment well   Patient Left in bed;with call bell/phone within reach;with bed alarm set   Nurse Communication Mobility status        Time: 2956-2130 OT Time Calculation (min): 28 min  Charges: OT General Charges $OT Visit: 1 Visit OT Treatments $Self Care/Home Management : 23-37 mins  Cortana Vanderford L. Jerris Fleer, OTR/L  03/09/24, 11:18 AM

## 2024-03-09 NOTE — Progress Notes (Signed)
 PROGRESS NOTE    Dennis Osborn  UJW:[id number] DOB: [birth year] DOA: 02/29/2024 PCP: Abram Abraham, NP-C    Chief Complaint  Patient presents with   Fall    Brief Narrative:  83 y.o. male with medical history significant of hyperlipidemia, BPH presented with a fall and was found to have acute right subcapital femoral neck fracture.  Orthopedics was consulted.  He underwent surgical intervention by orthopedics on 03/01/2024.  PT now recommending SNF.  TOC consulted. Patient fell on the morning of 03/03/24: was found to have periprosthetic hip dislocation requiring surgical intervention again.  Currently medically stable for discharge.  Insurance denied SNF placement on 03/06/2024; patient and family have appealed.    Assessment & Plan:   Principal Problem:   Closed subcapital fracture of femur (HCC) Active Problems:   Hip dislocation, right (HCC)   Hyperlipidemia   Leukocytosis   Metabolic acidosis  #1 acute right subcapital femoral neck fracture after mechanical fall -Status post surgical intervention per orthopedics on 03/01/2024. - Patient seen by PT/OT who recommended SNF placement. - Continue current pain management, DVT prophylaxis, wound care per orthopedics. - Patient noted to have been medically stable, insurance denied SNF placement on 03/06/2024 and family have appealed with appeal process pending. - Per orthopedics.  2.  Mechanical fall with periprosthetic hip dislocation -Status post closed reduction right hip per orthopedics, Dr. Charol Copas 03/03/2024. - PT/OT.  3.  Leukocytosis -Felt likely to be reactive and noted to be trending down. - Patient afebrile.  4.  Acute metabolic acidosis - Resolved.  5.  Hyperlipidemia - Statin.    DVT prophylaxis: Aspirin  Code Status: Full Family Communication: Updated patient, sister at bedside. Disposition: Awaiting appeal for SNF placement.   Status is: Inpatient Remains inpatient appropriate because: Unsafe disposition    Consultants:  Orthopedics: Dr. Charol Copas 02/29/2024  Procedures:  Right total hip arthroplasty, anterior approach per orthopedics: Dr. Charol Copas 03/01/2024 Closed reduction right hip for periprosthetic right hip dislocation per orthopedics: Dr. Charol Copas 03/03/2024 Plain films of the right femur 02/29/2024 Plain films of the right femur 02/29/2024 Plain films of the L-spine 02/29/2024 Plain films of the pelvis 02/29/2024, 03/01/2024, 03/03/2024 Plain films of the right femur 03/03/2024 Plain films of the right hip and pelvis 03/03/2024  Antimicrobials:  Anti-infectives (From admission, onward)    Start     Dose/Rate Route Frequency Ordered Stop   03/01/24 2130  ceFAZolin  (ANCEF ) IVPB 2g/100 mL premix        2 g 200 mL/hr over 30 Minutes Intravenous Every 6 hours 03/01/24 1816 03/02/24 0303   03/01/24 1230  ceFAZolin  (ANCEF ) IVPB 2g/100 mL premix        2 g 200 mL/hr over 30 Minutes Intravenous On call to O.R. 03/01/24 1220 03/01/24 1525         Subjective: Laying in bed.  Denies any chest pain or shortness of breath.  No abdominal pain.  No significant right hip pain.  Tolerating current diet.  Sister at bedside.   Objective: Vitals:   03/08/24 0617 03/08/24 1420 03/08/24 2130 03/09/24 0531  BP: (!) 142/66 (!) 145/77 (!) 172/81 (!) 161/64  Pulse: (!) 59 76 74 (!) 57  Resp: 16 16 17 17   Temp: 97.9 F (36.6 C) 97.8 F (36.6 C) 97.7 F (36.5 C) (!) 97.5 F (36.4 C)  TempSrc: Oral  Oral Oral  SpO2: 96% 98% 93% 96%  Weight:      Height:        Intake/Output Summary (Last  24 hours) at 03/09/2024 1255 Last data filed at 03/09/2024 0841 Gross per 24 hour  Intake 1060 ml  Output 1900 ml  Net -840 ml   Filed Weights   02/29/24 1031 03/01/24 1313 03/03/24 1538  Weight: 72.6 kg 72.6 kg 72.6 kg    Examination:  General exam: NAD Respiratory system: Lungs clear to auscultation bilaterally.  No wheezes, no crackles, no rhonchi.  Fair air movement.  Speaking in full sentences.   Cardiovascular system: Regular rate rhythm no murmurs rubs or gallops.  No JVD.  No pitting lower extremity edema.  Gastrointestinal system: Abdomen is soft, nontender, nondistended, positive bowel sounds.  No rebound.  No guarding. Central nervous system: Alert and oriented. No focal neurological deficits. Extremities: Right lower extremity with brace on.  Skin: No rashes, lesions or ulcers Psychiatry: Judgement and insight appear normal. Mood & affect appropriate.     Data Reviewed: I have personally reviewed following labs and imaging studies  CBC: Recent Labs  Lab 03/09/24 0346  WBC 9.1  HGB 12.8*  HCT 38.9*  MCV 96.3  PLT 253    Basic Metabolic Panel: Recent Labs  Lab 03/09/24 0346  NA 136  K 4.0  CL 101  CO2 25  GLUCOSE 108*  BUN 25*  CREATININE 1.03  CALCIUM  8.5*    GFR: Estimated Creatinine Clearance: 51.7 mL/min (by C-G formula based on SCr of 1.03 mg/dL).  Liver Function Tests: No results for input(s): "AST", "ALT", "ALKPHOS", "BILITOT", "PROT", "ALBUMIN" in the last 168 hours.  CBG: No results for input(s): "GLUCAP" in the last 168 hours.   Recent Results (from the past 240 hours)  Surgical PCR screen     Status: None   Collection Time: 02/29/24 10:10 PM   Specimen: Nasal Mucosa; Nasal Swab  Result Value Ref Range Status   MRSA, PCR NEGATIVE NEGATIVE Final   Staphylococcus aureus NEGATIVE NEGATIVE Final    Comment: (NOTE) The Xpert SA Assay (FDA approved for NASAL specimens in patients 59 years of age and older), is one component of a comprehensive surveillance program. It is not intended to diagnose infection nor to guide or monitor treatment. Performed at [facility], [address], [place], [place] [address]          Radiology Studies: No results found.      Scheduled Meds:  aspirin   81 mg Oral BID   cholecalciferol  1,000 Units Oral Daily   docusate sodium   100 mg Oral BID   donepezil  10 mg Oral Daily    escitalopram  5 mg Oral Daily   multivitamin with minerals  1 tablet Oral Daily   pantoprazole   40 mg Oral Daily   rosuvastatin   10 mg Oral QHS   senna  1 tablet Oral BID   Continuous Infusions:   LOS: 9 days    Time spent: 35 minutes    Hilda Lovings, MD [facility]   To contact the attending provider between 7A-7P or the covering provider during after hours 7P-7A, please log into the web site [email or web address] and access using universal [place] password for that web site. If you do not have the password, please call the hospital operator.  03/09/2024, 12:55 PM

## 2024-03-09 NOTE — Progress Notes (Signed)
 Physical Therapy Treatment Patient Details Name: Dennis Osborn MRN: [id number] DOB: 06-26-1941 Today's Date: 03/09/2024   History of Present Illness Patient is a 83 year old male who was admitted with R hip fx 02/29/24, s/p THA-AA 03/01/24.  Pt fell morning of 5/16, imaging showed hip dislocation, s/p closed reduction.  Past medical history significant of hyperlipidemia, BPH.    PT Comments  Pt in bed when PT arrives, family present at bedside. Pt is able to complete 54ft of amb at supervision A with two wheeled walker. His cadence is improving with level surfaces and tolerance to activity improves as well. Pt negotiates 3 steps with use of bilateral hand rails at CGA-SBA, and is able to recall coorect sequencing. Due to pt home environment and level of function required for safe return home, pt will require continued PT and 24 hour supervision to mitigate risk of falls.     If plan is discharge home, recommend the following: A little help with bathing/dressing/bathroom;Assistance with cooking/housework;Assist for transportation;Help with stairs or ramp for entrance;A little help with walking and/or transfers   Can travel by private vehicle     Yes  Equipment Recommendations  Rolling walker (2 wheels)    Recommendations for Other Services       Precautions / Restrictions Precautions Precautions: Fall;Posterior Hip Precaution Booklet Issued: Yes (comment) Recall of Precautions/Restrictions: Impaired Precaution/Restrictions Comments: fall 03/03/24 Required Braces or Orthoses: Other Brace Other Brace: R hip abduction brace, wear at all times except for bathing/skin checks Restrictions Weight Bearing Restrictions Per Provider Order: Yes RLE Weight Bearing Per Provider Order: Weight bearing as tolerated     Mobility  Bed Mobility Overal bed mobility: Needs Assistance Bed Mobility: Supine to Sit     Supine to sit: Supervision     General bed mobility comments: to L side of bed to  maintain R hip precuations, cues for maintaining hip flexion <90 degrees    Transfers Overall transfer level: Needs assistance Equipment used: Rolling walker (2 wheels) Transfers: Sit to/from Stand Sit to Stand: Supervision                Ambulation/Gait Ambulation/Gait assistance: Supervision Gait Distance (Feet): 90 Feet Assistive device: Rolling walker (2 wheels) Gait Pattern/deviations: Wide base of support, Antalgic, Step-through pattern, Decreased stride length, Decreased stance time - right Gait velocity: decreased     General Gait Details: increased weight shift during LE advancement   Stairs Stairs: Yes Stairs assistance: Supervision Stair Management: Two rails Number of Stairs: 3 General stair comments: 3 shallow steps to progress early stair training, pt home setup requires negotiating door which blocks hand rails when open (partway up steps), or very shallow steps which do not allow him to place full foot on step.   Wheelchair Mobility     Tilt Bed    Modified Rankin (Stroke Patients Only)       Balance Overall balance assessment: Needs assistance, History of Falls Sitting-balance support: Feet supported, No upper extremity supported Sitting balance-Leahy Scale: Good     Standing balance support: Single extremity supported Standing balance-Leahy Scale: Fair                              Musician Communication: Impaired Factors Affecting Communication: Hearing impaired  Cognition Arousal: Alert Behavior During Therapy: WFL for tasks assessed/performed   PT - Cognitive impairments: No apparent impairments  Following commands: Impaired Following commands impaired: Follows multi-step commands with increased time    Cueing Cueing Techniques: Verbal cues, Gestural cues, Tactile cues  Exercises      General Comments General comments (skin integrity, edema, etc.): dons R hip abd  brace, able to recall 1/3 precuations at beginning of session and 2/3 at the end      Pertinent Vitals/Pain Pain Assessment Pain Assessment: No/denies pain Pain Intervention(s): Monitored during session    Home Living                          Prior Function            PT Goals (current goals can now be found in the care plan section) Acute Rehab PT Goals Patient Stated Goal: DC home PT Goal Formulation: With patient Time For Goal Achievement: 03/09/24 Potential to Achieve Goals: Good Progress towards PT goals: Progressing toward goals    Frequency    Min 6X/week      PT Plan      Co-evaluation              AM-PAC PT "6 Clicks" Mobility   Outcome Measure  Help needed turning from your back to your side while in a flat bed without using bedrails?: None Help needed moving from lying on your back to sitting on the side of a flat bed without using bedrails?: None Help needed moving to and from a bed to a chair (including a wheelchair)?: A Little Help needed standing up from a chair using your arms (e.g., wheelchair or bedside chair)?: A Little Help needed to walk in hospital room?: A Little Help needed climbing 3-5 steps with a railing? : A Little 6 Click Score: 20    End of Session Equipment Utilized During Treatment: Gait belt Activity Tolerance: Patient tolerated treatment well Patient left: with call bell/phone within reach;in chair;with chair alarm set;with family/visitor present Nurse Communication: Mobility status PT Visit Diagnosis: Difficulty in walking, not elsewhere classified (R26.2);Pain;History of falling (Z91.81) Pain - Right/Left: Right Pain - part of body: Hip     Time: 1420-1445 PT Time Calculation (min) (ACUTE ONLY): 25 min  Charges:    $Gait Training: 23-37 mins PT General Charges $$ ACUTE PT VISIT: 1 Visit                     Darien Eden, PT Acute Rehabilitation Services Office: [phone] 03/09/2024    Dennis Osborn 03/09/2024, 4:01 PM

## 2024-03-10 DIAGNOSIS — E785 Hyperlipidemia, unspecified: Secondary | ICD-10-CM | POA: Diagnosis not present

## 2024-03-10 DIAGNOSIS — D72829 Elevated white blood cell count, unspecified: Secondary | ICD-10-CM | POA: Diagnosis not present

## 2024-03-10 DIAGNOSIS — S73004D Unspecified dislocation of right hip, subsequent encounter: Secondary | ICD-10-CM | POA: Diagnosis not present

## 2024-03-10 DIAGNOSIS — S72011P Unspecified intracapsular fracture of right femur, subsequent encounter for closed fracture with malunion: Secondary | ICD-10-CM | POA: Diagnosis not present

## 2024-03-10 MED ORDER — SENNA 8.6 MG PO TABS: 1.0000 | ORAL_TABLET | Freq: Two times a day (BID) | ORAL | Status: AC

## 2024-03-10 MED ORDER — METHOCARBAMOL 500 MG PO TABS: 500.0000 mg | ORAL_TABLET | Freq: Four times a day (QID) | ORAL | 0 refills | Status: AC | PRN

## 2024-03-10 MED ORDER — PANTOPRAZOLE SODIUM 40 MG PO TBEC: 40.0000 mg | DELAYED_RELEASE_TABLET | Freq: Every day | ORAL | Status: AC

## 2024-03-10 MED ORDER — POLYETHYLENE GLYCOL 3350 17 G PO PACK: 17.0000 g | PACK | Freq: Every day | ORAL | Status: DC | PRN

## 2024-03-10 NOTE — Progress Notes (Signed)
 Occupational Therapy Treatment Patient Details Name: Dennis Osborn MRN: [id number] DOB: [birth year] Today's Date: 03/10/2024   History of present illness Patient is a 83 year old male who was admitted with R hip fx 02/29/24, s/p THA-AA 03/01/24.  Pt fell morning of 5/16, imaging showed hip dislocation, s/p closed reduction.  Past medical history significant of hyperlipidemia, BPH.   OT comments  Patient seen for skilled OT session this afternoon. Sister present and assisting patient to pack up if transported to rehab later this day. OT worked with patient to reinforce R posterior hip precautions and brace management with AE for LB self care and safety with RW. See status outlined below. Patient bcoemng more comfortable and aware of posterior hip precautions and open to all training and education. OT will continue to follow in Acute setting to progress safety. Patient will benefit from continued inpatient follow up therapy, <3 hours/day.         If plan is discharge home, recommend the following:  A little help with walking and/or transfers;A little help with bathing/dressing/bathroom;Assistance with cooking/housework;Direct supervision/assist for medications management;Assist for transportation;Help with stairs or ramp for entrance;Direct supervision/assist for financial management   Equipment Recommendations  Other (comment) (total hip kit)       Precautions / Restrictions Precautions Precautions: Fall;Posterior Hip Precaution Booklet Issued: Yes (comment) Recall of Precautions/Restrictions: Impaired Precaution/Restrictions Comments: fall 03/03/24 Required Braces or Orthoses: Other Brace Other Brace: R hip abduction brace, wear at all times except for bathing/skin checks Restrictions Weight Bearing Restrictions Per Provider Order: No RLE Weight Bearing Per Provider Order: Weight bearing as tolerated       Mobility Bed Mobility Overal bed mobility: Needs Assistance Bed Mobility: Supine  to Sit     Supine to sit: Supervision Sit to supine: Supervision   General bed mobility comments: min cues with maintaining 90 degrees < hip flexion    Transfers Overall transfer level: Needs assistance Equipment used: Rolling walker (2 wheels) Transfers: Sit to/from Stand Sit to Stand: Supervision                 Balance Overall balance assessment: Needs assistance, History of Falls Sitting-balance support: Feet supported, No upper extremity supported Sitting balance-Leahy Scale: Good     Standing balance support: Bilateral upper extremity supported Standing balance-Leahy Scale: Fair Standing balance comment: adjusted brace in supported standing                           ADL either performed or assessed with clinical judgement   ADL Overall ADL's : Needs assistance/impaired                     Lower Body Dressing: Moderate assistance;Sitting/lateral leans;Sit to/from stand Lower Body Dressing Details (indicate cue type and reason): educated on reacher , sock aide and posterior hip precautions with AE this session             Functional mobility during ADLs: Contact guard assist;Rolling walker (2 wheels);Cueing for sequencing;Cueing for safety General ADL Comments: Continued to reinforce AE and posterior hip precautions    Extremity/Trunk Assessment Upper Extremity Assessment Upper Extremity Assessment: Overall WFL for tasks assessed   Lower Extremity Assessment Lower Extremity Assessment: Defer to PT evaluation        Vision       Perception     Praxis     Communication Communication Communication: Impaired Factors Affecting Communication: Hearing impaired   Cognition Arousal:  Alert Behavior During Therapy: WFL for tasks assessed/performed Cognition: Cognition impaired       Memory impairment (select all impairments): Short-term memory, Working memory     OT - Cognition Comments: able to teach back posterior hip  precautions                 Following commands: Intact Following commands impaired: Follows multi-step commands with increased time      Cueing   Cueing Techniques: Verbal cues, Gestural cues, Tactile cues  Exercises      Shoulder Instructions       General Comments post op incision intact R hip and ice packs in place    Pertinent Vitals/ Pain       Pain Assessment Pain Assessment: No/denies pain   Frequency  Min 2X/week        Progress Toward Goals  OT Goals(current goals can now be found in the care plan section)  Progress towards OT goals: Progressing toward goals  Acute Rehab OT Goals Patient Stated Goal: to be able to do more OT Goal Formulation: With patient/family Time For Goal Achievement: 03/22/24 Potential to Achieve Goals: Good ADL Goals Pt Will Perform Lower Body Dressing: with modified independence;with adaptive equipment;sit to/from stand Pt Will Transfer to Toilet: with modified independence;ambulating;regular height toilet Pt Will Perform Toileting - Clothing Manipulation and hygiene: with modified independence;sit to/from stand Additional ADL Goal #1: patient to don/doff hip abductor brace with supervision to be able to return home  Plan         AM-PAC OT "6 Clicks" Daily Activity     Outcome Measure   Help from another person eating meals?: None Help from another person taking care of personal grooming?: None Help from another person toileting, which includes using toliet, bedpan, or urinal?: A Little Help from another person bathing (including washing, rinsing, drying)?: A Lot Help from another person to put on and taking off regular upper body clothing?: A Little Help from another person to put on and taking off regular lower body clothing?: A Lot 6 Click Score: 18    End of Session Equipment Utilized During Treatment: Gait belt;Rolling walker (2 wheels);Other (comment)  OT Visit Diagnosis: Unsteadiness on feet (R26.81);Other  abnormalities of gait and mobility (R26.89)   Activity Tolerance Patient tolerated treatment well   Patient Left in bed;with call bell/phone within reach;with bed alarm set   Nurse Communication Mobility status        Time: 1610-9604 OT Time Calculation (min): 28 min  Charges: OT General Charges $OT Visit: 1 Visit OT Treatments $Self Care/Home Management : 23-37 mins  Vashon Riordan OT/L Acute Rehabilitation Department  [phone]  03/10/2024, 4:12 PM

## 2024-03-10 NOTE — TOC Progression Note (Signed)
 Transition of Care [facility]) - Progression Note    Patient Details  Name: Dennis Osborn MRN: [id number] Date of Birth: [birth year]  [facility] CM/SW Contact  Levie Ream, RN Phone Number: 03/10/2024, 1:15 PM  Clinical Narrative:    D/C orders and summary received received; pt discharge to [facility] [address] call report # [phone]; transport by Lyna Sandhoff; [facility] called at [phone]; spoke w/ [name]; Candi Chafe at [facility] notified of pt's d/c today; no TOC needs.   Expected Discharge Plan: Skilled Nursing Facility Barriers to Discharge: No Barriers Identified  Expected Discharge Plan and Services In-house Referral: Clinical Social Work     Living arrangements for the past 2 months: Single Family Home Expected Discharge Date: 03/10/24                                     Social Determinants of Health (SDOH) Interventions SDOH Screenings   Food Insecurity: No Food Insecurity (02/29/2024)  Housing: Low Risk  (02/29/2024)  Transportation Needs: No Transportation Needs (02/29/2024)  Utilities: Not At Risk (02/29/2024)  Depression (PHQ2-9): Low Risk  (01/18/2024)  Social Connections: Unknown (02/27/2022)   Received from [facility]  Tobacco Use: Low Risk  (03/03/2024)    Readmission Risk Interventions    03/01/2024   12:26 PM  Readmission Risk Prevention Plan  Post Dischage Appt Complete  Medication Screening Complete  Transportation Screening Complete

## 2024-03-10 NOTE — Discharge Summary (Signed)
 Physician Discharge Summary  Dennis Osborn ZOX:[id number] DOB: Jul 31, 1941 DOA: 02/29/2024  PCP: Abram Abraham, NP-C  Admit date: 02/29/2024 Discharge date: 03/10/2024  Time spent: 60 minutes  Recommendations for Outpatient Follow-up:  Follow-up with Trixie Furnace, PA, orthopedics on 03/20/2024. Follow-up with MD at skilled nursing facility.   Discharge Diagnoses:  Principal Problem:   Closed subcapital fracture of femur (HCC) Active Problems:   Hip dislocation, right (HCC)   Hyperlipidemia   Leukocytosis   Metabolic acidosis   Discharge Condition: Stable  Diet recommendation: Regular  Filed Weights   02/29/24 1031 03/01/24 1313 03/03/24 1538  Weight: 72.6 kg 72.6 kg 72.6 kg    History of present illness:  HPI per Dr. Melvin Kadeem R Spranger is a 83 y.o. male with medical history significant of hyperlipidemia, BPH presenting after fall at home.   Fell at home while while walking out side working on something after tripping over a small drain pipe. Landed on his right hip.  Had some pain but was able to get up, but pain worsened and persistent so he called EMS to be evaluated in the ED.   Denies fevers, chills, chest pain, shortness of breath, abdominal pain, constipation, diarrhea, nausea, vomiting.   ED Course: Vital signs in the ED notable for blood pressure in the 170 systolic and heart rate in the 50s.  Lab workup included BMP with bicarb 21, glucose 111.  CBC with leukocytosis to 15.6.  PT and INR normal.  L-spine x-ray showed no acute normality.  Right femur x-ray and pelvis x-ray showed acute right subcapital femoral neck fracture.  Patient received Dilaudid  and Zofran  in the ED.  Case was discussed with orthopedic surgery who requested admission to [facility] for surgical correction.  Hospital Course:  #1 acute right subcapital femoral neck fracture after mechanical fall -Status post surgical intervention per orthopedics on 03/01/2024. - Patient seen by PT/OT who recommended  SNF placement. - Patient maintained on pain regimen, DVT prophylaxis and wound care per orthopedics.  - Patient noted to have been medically stable, insurance denied SNF placement on 03/06/2024 and family have appealed. - Appeal process gone through and patient to be discharged to skilled nursing facility.  - Outpatient follow-up with orthopedics.    2.  Mechanical fall with periprosthetic hip dislocation -Status post closed reduction right hip per orthopedics, Dr. Charol Copas 03/03/2024. - Patient followed by PT/OT who recommended SNF placement. -Outpatient follow-up with orthopedics.   3.  Leukocytosis -Felt likely to be reactive and noted to be trending down. -Resolved by day of discharge.   4.  Acute metabolic acidosis - Resolved.   5.  Hyperlipidemia - Patient maintained on statin.   Procedures: Right total hip arthroplasty, anterior approach per orthopedics: Dr. Charol Copas 03/01/2024 Closed reduction right hip for periprosthetic right hip dislocation per orthopedics: Dr. Charol Copas 03/03/2024 Plain films of the right femur 02/29/2024 Plain films of the right femur 02/29/2024 Plain films of the L-spine 02/29/2024 Plain films of the pelvis 02/29/2024, 03/01/2024, 03/03/2024 Plain films of the right femur 03/03/2024 Plain films of the right hip and pelvis 03/03/2024  Consultations: Orthopedics: Dr. Charol Copas 02/29/2024   Discharge Exam: Vitals:   03/09/24 2126 03/10/24 0540  BP: (!) 144/69 (!) 156/68  Pulse: 64 60  Resp: 16 14  Temp: 98 F (36.7 C) 97.7 F (36.5 C)  SpO2: 95% 97%    General: NAD Cardiovascular: RRR no murmurs rubs or gallops.  No JVD.  No lower extremity edema. Respiratory: Clear to auscultation  anterior lung fields.  No wheezes, no crackles, no rhonchi.  Fair air movement.  Speaking full sentences.  Discharge Instructions   Discharge Instructions     Diet general   Complete by: As directed    Increase activity slowly   Complete by: As directed    No wound  care   Complete by: As directed       Allergies as of 03/10/2024       Reactions   Oxycodone Other (See Comments)   Causes confusion        Medication List     TAKE these medications    aspirin  81 MG chewable tablet Commonly known as: Aspirin  Childrens Chew 1 tablet (81 mg total) by mouth 2 (two) times daily with a meal.   donepezil 10 MG tablet Commonly known as: ARICEPT Take 10 mg by mouth daily.   escitalopram 5 MG tablet Commonly known as: LEXAPRO Take 5 mg by mouth daily.   methocarbamol  500 MG tablet Commonly known as: ROBAXIN  Take 1 tablet (500 mg total) by mouth every 6 (six) hours as needed for muscle spasms.   multivitamin with minerals Tabs tablet Take 1 tablet by mouth daily.   pantoprazole  40 MG tablet Commonly known as: PROTONIX  Take 1 tablet (40 mg total) by mouth daily. Start taking on: Mar 11, 2024   polyethylene glycol 17 g packet Commonly known as: MIRALAX  / GLYCOLAX  Take 17 g by mouth daily as needed for mild constipation.   rosuvastatin  10 MG tablet Commonly known as: CRESTOR  Take 10 mg by mouth at bedtime.   senna 8.6 MG Tabs tablet Commonly known as: SENOKOT Take 1 tablet (8.6 mg total) by mouth 2 (two) times daily.   VITAMIN D3 PO Take 1 tablet by mouth daily.       ASK your doctor about these medications    HYDROcodone -acetaminophen  5-325 MG tablet Commonly known as: NORCO/VICODIN Take 1 tablet by mouth every 4 (four) hours as needed for up to 7 days for moderate pain (pain score 4-6) or severe pain (pain score 7-10). Ask about: Should I take this medication?       Allergies  Allergen Reactions   Oxycodone Other (See Comments)    Causes confusion     Contact information for follow-up providers     Harman Lightning, PA-C. Schedule an appointment as soon as possible for a visit on 03/20/2024.   Specialty: Orthopedic Surgery Why: For suture removal, For wound re-check Contact information: [address], [address] [place] [address] [phone]         MD AT SNF Follow up.               Contact information for after-discharge care     Destination     [facility] .   Service: Skilled Nursing Contact information: [address] [place]  [address] [phone]                      The results of significant diagnostics from this hospitalization (including imaging, microbiology, ancillary and laboratory) are listed below for reference.    Significant Diagnostic Studies: DG HIP UNILAT WITH PELVIS 1V RIGHT Result Date: 03/03/2024 CLINICAL DATA:  Status post reduction of dislocated total right hip arthroplasty. EXAM: DG HIP (WITH OR WITHOUT PELVIS) 1V RIGHT COMPARISON:  Pelvis and right hip radiographs 03/03/2024 FINDINGS: Single frontal view of the right hip. There is improved, now appropriate alignment of the right  acetabular cup and femoral head of total right hip arthroplasty prosthesis. No perihardware lucency is seen to indicate hardware failure or loosening. The distal tip of the femoral stem is not imaged. Moderate lateral right hip soft tissue swelling and edema, similar to prior. IMPRESSION: Interval relocation of the total right hip arthroplasty. Electronically Signed   By: Bertina Broccoli M.D.   On: 03/03/2024 18:14   DG Pelvis 1-2 Views Result Date: 03/03/2024 CLINICAL DATA:  Right hip pain following a fall today. EXAM: PELVIS - 1-2 VIEW COMPARISON:  03/01/2024 FINDINGS: Interval superior dislocation of the femoral head component of the right hip prosthesis. No visible fracture. Lower lumbar spine degenerative changes. IMPRESSION: Superior dislocation of the femoral head component of the right hip prosthesis. These results will be called to the ordering clinician or representative by the Radiologist Assistant, and communication documented in the PACS or [employer]. Electronically Signed   By: Catherin Closs M.D.   On: 03/03/2024 14:44    DG Femur Min 2 Views Right Result Date: 03/03/2024 CLINICAL DATA:  Fall and right hip pain. EXAM: RIGHT FEMUR 2 VIEWS COMPARISON:  Pelvic radiograph dated 03/03/2024. FINDINGS: Total right hip arthroplasty. There is superior dislocation of the femoral component of the arthroplasty. No acute fracture. Small pockets of subcutaneous air may represent skin ablation. Clinical correlation recommended. IMPRESSION: Superior dislocation of the femoral component of the right hip arthroplasty. Electronically Signed   By: Angus Bark M.D.   On: 03/03/2024 14:43   DG HIP PORT UNILAT WITH PELVIS 1V RIGHT Result Date: 03/03/2024 CLINICAL DATA:  Closed dislocation of right hip. EXAM: DG HIP (WITH OR WITHOUT PELVIS) 1V PORT RIGHT COMPARISON:  Same day. FINDINGS: Single cross-table lateral projection of the hip was obtained. This demonstrates superior dislocation of right femoral prosthesis. IMPRESSION: Superior dislocation of right femoral prosthesis is noted on lateral radiograph only. Electronically Signed   By: Rosalene Colon M.D.   On: 03/03/2024 13:39   DG Pelvis Portable Result Date: 03/01/2024 CLINICAL DATA:  [id number] Closed subcapital fracture of neck of right femur, initial encounter [facility]) [id number] EXAM: PORTABLE PELVIS 1-2 VIEWS COMPARISON:  Feb 29, 2024 FINDINGS: There is no evidence of pelvic fracture or diastasis. No pelvic bone lesions are seen. Interval placement of a right hip arthroplasty, which is anatomically aligned without dislocation. No acute bone fracture of the femur. 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 Soft tissue edema and extensive subcutaneous emphysema along the right hip, expected findings at this stage in the postoperative recovery. IMPRESSION: Well-aligned right hip arthroplasty without dislocation or acute bone fracture. No radiopaque foreign body or retained surgical instrument. Electronically Signed   By: Rance Burrows M.D.   On: 03/01/2024 18:25    DG HIP UNILAT WITH PELVIS 1V RIGHT Result Date: 03/01/2024 CLINICAL DATA:  Right total hip arthroplasty. EXAM: DG HIP (WITH OR WITHOUT PELVIS) 1V RIGHT COMPARISON:  Radiographs 02/29/2024. FINDINGS: C-arm fluoroscopy was provided in the operating room without the presence of a radiologist.8 seconds fluoroscopy time. 1.0393 mGy air kerma. Eight C-arm fluoroscopic images were obtained intraoperatively and are submitted for post operative interpretation. Images demonstrate the performance of a right total hip arthroplasty. On the final images, the hardware appears well positioned. No complications are identified. Please see intraoperative findings for further detail. IMPRESSION: Intraoperative fluoroscopic guidance for right total hip arthroplasty. Electronically Signed   By: Elmon Hagedorn M.D.   On: 03/01/2024 16:57   DG C-Arm 1-60 Min-No Report Result Date: 03/01/2024  Fluoroscopy was utilized by the requesting physician.  No radiographic interpretation.   DG C-Arm 1-60 Min-No Report Result Date: 03/01/2024 Fluoroscopy was utilized by the requesting physician.  No radiographic interpretation.   DG Femur Min 2 Views Right Result Date: 02/29/2024 CLINICAL DATA:  Fall, acute right hip pain EXAM: RIGHT FEMUR 2 VIEWS COMPARISON:  02/29/2024 FINDINGS: Bones are osteopenic. There is an acute angulated and minimally displaced right hip subcapital femoral neck fracture. No associated hip joint malalignment, subluxation or dislocation. Included pelvis intact. No diastasis. SI joints maintained. Peripheral vascular calcifications noted. IMPRESSION: Acute right hip subcapital femoral neck fracture. Electronically Signed   By: Melven Stable.  Shick M.D.   On: 02/29/2024 13:04   DG Pelvis 1-2 Views Result Date: 02/29/2024 CLINICAL DATA:  Recent fall, acute right hip pain EXAM: PELVIS - 1-2 VIEW COMPARISON:  02/29/2024 FINDINGS: Bones are osteopenic. There is an acute minimally displaced and angulated fracture of the right  hip subcapital femoral neck. Bony pelvis and left hip appear intact. No diastasis. SI joints are maintained. Soft tissues unremarkable. IMPRESSION: Acute right hip subcapital femoral neck fracture. Electronically Signed   By: Melven Stable.  Shick M.D.   On: 02/29/2024 13:03   DG Lumbar Spine Complete Result Date: 02/29/2024 CLINICAL DATA:  Recent fall earlier this morning, back and hip pain EXAM: LUMBAR SPINE - COMPLETE 4+ VIEW COMPARISON:  06/13/2017 thoracic spine MRI FINDINGS: Bones are osteopenic. 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 Mild posterior facet arthropathy spanning L3-S1. Preserved lumbar vertebral body heights. No acute compression fracture, wedge-shaped deformity or focal kyphosis. No pars defects. Chronic T12 compression deformity noted. SI joints are maintained. IMPRESSION: 1. Osteopenia and degenerative changes as above. 2. No acute finding by plain radiography. Electronically Signed   By: Melven Stable.  Shick M.D.   On: 02/29/2024 13:01    Microbiology: Recent Results (from the past 240 hours)  Surgical PCR screen     Status: None   Collection Time: 02/29/24 10:10 PM   Specimen: Nasal Mucosa; Nasal Swab  Result Value Ref Range Status   MRSA, PCR NEGATIVE NEGATIVE Final   Staphylococcus aureus NEGATIVE NEGATIVE Final    Comment: (NOTE) The Xpert SA Assay (FDA approved for NASAL specimens in patients 56 years of age and older), is one component of a comprehensive surveillance program. It is not intended to diagnose infection nor to guide or monitor treatment. Performed at [facility], [address], [place], [place] [address]      Labs: Basic Metabolic Panel: Recent Labs  Lab 03/09/24 0346  NA 136  K 4.0  CL 101  CO2 25  GLUCOSE 108*  BUN 25*  CREATININE 1.03  CALCIUM  8.5*   Liver Function Tests: No results for input(s): "AST", "ALT", "ALKPHOS", "BILITOT",  "PROT", "ALBUMIN" in the last 168 hours. No results for input(s): "LIPASE", "AMYLASE" in the last 168 hours. No results for input(s): "AMMONIA" in the last 168 hours. CBC: Recent Labs  Lab 03/09/24 0346  WBC 9.1  HGB 12.8*  HCT 38.9*  MCV 96.3  PLT 253   Cardiac Enzymes: No results for input(s): "CKTOTAL", "CKMB", "CKMBINDEX", "TROPONINI" in the last 168 hours. BNP: BNP (last 3 results) No results for input(s): "BNP" in the last 8760 hours.  ProBNP (last 3 results) No results for input(s): "PROBNP" in the last 8760 hours.  CBG: No results for input(s): "GLUCAP" in the last 168 hours.  Signed:  Hilda Lovings MD.  [facility] 03/10/2024, 1:08 PM

## 2024-03-10 NOTE — Plan of Care (Signed)
  Problem: Health Behavior/Discharge Planning: Goal: Ability to manage health-related needs will improve Outcome: Adequate for Discharge   Problem: Clinical Measurements: Goal: Ability to maintain clinical measurements within normal limits will improve Outcome: Adequate for Discharge Goal: Will remain free from infection Outcome: Adequate for Discharge Goal: Diagnostic test results will improve Outcome: Adequate for Discharge Goal: Respiratory complications will improve Outcome: Adequate for Discharge Goal: Cardiovascular complication will be avoided Outcome: Adequate for Discharge   Problem: Activity: Goal: Risk for activity intolerance will decrease 03/10/2024 1301 by Dennis Gillis A, LPN Outcome: Adequate for Discharge 03/10/2024 0954 by Dennis Hymen, LPN Outcome: Progressing   Problem: Nutrition: Goal: Adequate nutrition will be maintained 03/10/2024 1301 by Dennis Hymen, LPN Outcome: Adequate for Discharge 03/10/2024 0954 by Dennis Hymen, LPN Outcome: Progressing   Problem: Coping: Goal: Level of anxiety will decrease 03/10/2024 1301 by Dennis Gillis A, LPN Outcome: Adequate for Discharge 03/10/2024 0954 by Dennis Hymen, LPN Outcome: Progressing   Problem: Elimination: Goal: Will not experience complications related to bowel motility Outcome: Adequate for Discharge   Problem: Pain Managment: Goal: General experience of comfort will improve and/or be controlled 03/10/2024 1301 by Dennis Hymen, LPN Outcome: Adequate for Discharge 03/10/2024 0954 by Dennis Hymen, LPN Outcome: Progressing   Problem: Safety: Goal: Ability to remain free from injury will improve Outcome: Adequate for Discharge   Problem: Skin Integrity: Goal: Risk for impaired skin integrity will decrease Outcome: Adequate for Discharge   Problem: Education: Goal: Verbalization of understanding the information provided (i.e., activity precautions, restrictions, etc) will  improve Outcome: Adequate for Discharge Goal: Individualized Educational Video(s) Outcome: Adequate for Discharge   Problem: Activity: Goal: Ability to ambulate and perform ADLs will improve Outcome: Adequate for Discharge   Problem: Clinical Measurements: Goal: Postoperative complications will be avoided or minimized Outcome: Adequate for Discharge   Problem: Self-Concept: Goal: Ability to maintain and perform role responsibilities to the fullest extent possible will improve Outcome: Adequate for Discharge   Problem: Pain Management: Goal: Pain level will decrease Outcome: Adequate for Discharge   Problem: Education: Goal: Knowledge of the prescribed therapeutic regimen will improve Outcome: Adequate for Discharge Goal: Understanding of discharge needs will improve Outcome: Adequate for Discharge Goal: Individualized Educational Video(s) Outcome: Adequate for Discharge   Problem: Activity: Goal: Ability to avoid complications of mobility impairment will improve Outcome: Adequate for Discharge Goal: Ability to tolerate increased activity will improve Outcome: Adequate for Discharge   Problem: Clinical Measurements: Goal: Postoperative complications will be avoided or minimized Outcome: Adequate for Discharge   Problem: Pain Management: Goal: Pain level will decrease with appropriate interventions Outcome: Adequate for Discharge   Problem: Skin Integrity: Goal: Will show signs of wound healing Outcome: Adequate for Discharge

## 2024-03-10 NOTE — Progress Notes (Signed)
 Physical Therapy Treatment Patient Details Name: Dennis Osborn MRN: [id number] DOB: May 11, 1941 Today's Date: 03/10/2024   History of Present Illness Patient is a 83 year old male who was admitted with R hip fx 02/29/24, s/p THA-AA 03/01/24.  Pt fell morning of 5/16, imaging showed hip dislocation, s/p closed reduction.  Past medical history significant of hyperlipidemia, BPH.    PT Comments  POD # 7 Assisted OOB to amb then perform TE's.  Pt required increased time and more fatigued today.   Pt will need ST Rehab at SNF to address mobility and functional decline prior to safely returning home.    If plan is discharge home, recommend the following: A little help with bathing/dressing/bathroom;Assistance with cooking/housework;Assist for transportation;Help with stairs or ramp for entrance;A little help with walking and/or transfers   Can travel by private vehicle     Yes  Equipment Recommendations  Rolling walker (2 wheels)    Recommendations for Other Services       Precautions / Restrictions Precautions Precautions: Fall;Posterior Hip Precaution/Restrictions Comments: fall 03/03/24 Other Brace: R hip abduction brace, wear at all times except for bathing/skin checks Restrictions Weight Bearing Restrictions Per Provider Order: No RLE Weight Bearing Per Provider Order: Weight bearing as tolerated     Mobility  Bed Mobility Overal bed mobility: Needs Assistance Bed Mobility: Supine to Sit     Supine to sit: Supervision     General bed mobility comments: to L side of bed to maintain R hip precuations, cues for maintaining hip flexion <90 degrees    Transfers   Equipment used: Rolling walker (2 wheels) Transfers: Sit to/from Stand Sit to Stand: Supervision           General transfer comment: min vcs throughout. pt mildly impulsive and requires cues to sequence transfer for balance    Ambulation/Gait Ambulation/Gait assistance: Supervision, Contact guard assist, Min  assist Gait Distance (Feet): 65 Feet Assistive device: Rolling walker (2 wheels) Gait Pattern/deviations: Wide base of support, Antalgic, Step-through pattern, Decreased stride length, Decreased stance time - right Gait velocity: decreased     General Gait Details: decreased amb distance due to increased c/o fatigue   Stairs             Wheelchair Mobility     Tilt Bed    Modified Rankin (Stroke Patients Only)       Balance                                            Communication Communication Factors Affecting Communication: Hearing impaired  Cognition Arousal: Alert Behavior During Therapy: WFL for tasks assessed/performed   PT - Cognitive impairments: No apparent impairments                       PT - Cognition Comments: AxO x 3 pleasant and motivated Following commands: Intact      Cueing    Exercises  Total Hip Replacement TE's following HEP Handout 10 reps ankle pumps 05 reps knee presses 05 reps heel slides 05 reps SAQ's 05 reps ABD Instructed how to use a belt loop to assist  Followed by [facility]     General Comments        Pertinent Vitals/Pain Pain Assessment Pain Assessment: No/denies pain    Home Living  Prior Function            PT Goals (current goals can now be found in the care plan section) Progress towards PT goals: Progressing toward goals    Frequency    Min 6X/week      PT Plan      Co-evaluation              AM-PAC PT "6 Clicks" Mobility   Outcome Measure  Help needed turning from your back to your side while in a flat bed without using bedrails?: A Little Help needed moving from lying on your back to sitting on the side of a flat bed without using bedrails?: A Little Help needed moving to and from a bed to a chair (including a wheelchair)?: A Little Help needed standing up from a chair using your arms (e.g., wheelchair or bedside chair)?: A  Little Help needed to walk in hospital room?: A Little Help needed climbing 3-5 steps with a railing? : A Lot 6 Click Score: 17    End of Session Equipment Utilized During Treatment: Gait belt Activity Tolerance: Patient tolerated treatment well Patient left: with call bell/phone within reach;in chair;with chair alarm set;with family/visitor present Nurse Communication: Mobility status PT Visit Diagnosis: Difficulty in walking, not elsewhere classified (R26.2);Pain;History of falling (Z91.81) Pain - Right/Left: Right Pain - part of body: Hip     Time: 6962-9528 PT Time Calculation (min) (ACUTE ONLY): 32 min  Charges:    $Gait Training: 8-22 mins $Therapeutic Exercise: 8-22 mins PT General Charges $$ ACUTE PT VISIT: 1 Visit                     Bess Broody  PTA Acute  Rehabilitation Services Office M-F          [phone]

## 2024-03-10 NOTE — TOC Progression Note (Addendum)
 Transition of Care [facility]) - Progression Note    Patient Details  Name: Dennis Osborn MRN: [id number] Date of Birth: 1941/03/12  Transition of [facility]) CM/SW Contact  Levie Ream, RN Phone Number: 03/10/2024, 11:20 AM  Clinical Narrative:    Confirmed w/ [place] at [facility] that pt has been approved for SNF, Plan Auth ID # [id number], Auth ID # [id number]; he says agency should be notified when pt is ready to d/c from hospital so that dates can be updated; notified Soy at [facility]; she will send new documents for pt's dtr to sign; Soy says pt can admit today, and she will notify this RN, CM when documents received; spoke w/ dtr Eleni Griffin [phone]); she agreed to d/c plan; Dr Hildy Lowers notified via secure chat; awaiting return call from facility  -1208- return call from [facility]; she gave RM# [phone], call report # [phone]; awaiting D/C summary  -1230- spoke w/ Dr Hildy Lowers; he says pt ready for d/c; awaiting d/c summary and orders  Expected Discharge Plan: Skilled Nursing Facility Barriers to Discharge: Continued Medical Work up  Expected Discharge Plan and Services In-house Referral: Clinical Social Work     Living arrangements for the past 2 months: Single Family Home                                       Social Determinants of Health (SDOH) Interventions SDOH Screenings   Food Insecurity: No Food Insecurity (02/29/2024)  Housing: Low Risk  (02/29/2024)  Transportation Needs: No Transportation Needs (02/29/2024)  Utilities: Not At Risk (02/29/2024)  Depression (PHQ2-9): Low Risk  (01/18/2024)  Social Connections: Unknown (02/27/2022)   Received from [facility]  Tobacco Use: Low Risk  (03/03/2024)    Readmission Risk Interventions    03/01/2024   12:26 PM  Readmission Risk Prevention Plan  Post Dischage Appt Complete  Medication Screening Complete  Transportation Screening Complete

## 2024-03-10 NOTE — Plan of Care (Signed)
   Problem: Activity: Goal: Risk for activity intolerance will decrease Outcome: Progressing   Problem: Nutrition: Goal: Adequate nutrition will be maintained Outcome: Progressing   Problem: Coping: Goal: Level of anxiety will decrease Outcome: Progressing   Problem: Pain Managment: Goal: General experience of comfort will improve and/or be controlled Outcome: Progressing

## 2024-03-20 ENCOUNTER — Telehealth: Payer: Self-pay | Admitting: Family Medicine

## 2024-03-20 ENCOUNTER — Telehealth: Payer: Self-pay

## 2024-03-20 NOTE — Telephone Encounter (Signed)
 Appt made for documentation purposes with available provider, stephanie matthews for 6/3

## 2024-03-20 NOTE — Telephone Encounter (Signed)
 Copied from CRM [id number]. Topic: General - Other >> Mar 20, 2024  2:11 PM Corin V wrote: Reason for CRM: Patient daughter would like to know if Meals on Wheels or other services in area to assist her dad since he broke his leg/hip and can't get around. Her and her brother are helping as much as possible but won't be around full time and want to ensure he has food when they are not there. Please callback at [phone]

## 2024-03-20 NOTE — Telephone Encounter (Signed)
 Appt made w available provider, stephanie matthews on 6/3 for referrals to be placed for documentation puposes

## 2024-03-20 NOTE — Telephone Encounter (Signed)
 Copied from CRM [id number]. Topic: Referral - Request for Referral >> Mar 20, 2024  2:06 PM Corin V wrote: Did the patient discuss referral with their provider in the last year? No- was in hospital for broken hip, rehab kicked him out for insurance stopping paying and did not set up follow up care (If No - schedule appointment) (If Yes - send message)  Appointment offered? Yes- very difficult for him to move, do not want to schedule appointment unless absolutely needed  Type of order/referral and detailed reason for visit: Home Health for Physical therapy, skilled nursing (bathing, possibly other needs)  Preference of office, provider, location: [facility] or [facility]  If referral order, have you been seen by this specialty before? No (If Yes, this issue or another issue? When? Where?  Can we respond through MyChart? No- call daughter at [phone]

## 2024-03-21 ENCOUNTER — Encounter: Payer: Self-pay | Admitting: Family Medicine

## 2024-03-21 ENCOUNTER — Ambulatory Visit: Admitting: Family Medicine

## 2024-03-21 VITALS — BP 132/60 | HR 69 | Temp 98.7°F | Ht 68.0 in | Wt 142.0 lb

## 2024-03-21 DIAGNOSIS — D72829 Elevated white blood cell count, unspecified: Secondary | ICD-10-CM | POA: Diagnosis not present

## 2024-03-21 DIAGNOSIS — E872 Acidosis, unspecified: Secondary | ICD-10-CM

## 2024-03-21 DIAGNOSIS — R531 Weakness: Secondary | ICD-10-CM | POA: Diagnosis not present

## 2024-03-21 DIAGNOSIS — Z7409 Other reduced mobility: Secondary | ICD-10-CM | POA: Insufficient documentation

## 2024-03-21 DIAGNOSIS — Z6821 Body mass index (BMI) 21.0-21.9, adult: Secondary | ICD-10-CM

## 2024-03-21 DIAGNOSIS — N3000 Acute cystitis without hematuria: Secondary | ICD-10-CM

## 2024-03-21 DIAGNOSIS — Z96641 Presence of right artificial hip joint: Secondary | ICD-10-CM | POA: Insufficient documentation

## 2024-03-21 DIAGNOSIS — R413 Other amnesia: Secondary | ICD-10-CM

## 2024-03-21 DIAGNOSIS — N3001 Acute cystitis with hematuria: Secondary | ICD-10-CM | POA: Insufficient documentation

## 2024-03-21 LAB — COMPREHENSIVE METABOLIC PANEL WITH GFR
ALT: 17 U/L (ref 0–53)
AST: 18 U/L (ref 0–37)
Albumin: 3.7 g/dL (ref 3.5–5.2)
Alkaline Phosphatase: 104 U/L (ref 39–117)
BUN: 15 mg/dL (ref 6–23)
CO2: 31 meq/L (ref 19–32)
Calcium: 9.3 mg/dL (ref 8.4–10.5)
Chloride: 100 meq/L (ref 96–112)
Creatinine, Ser: 1.04 mg/dL (ref 0.40–1.50)
GFR: 65.98 mL/min (ref >60.00)
Glucose, Bld: 146 mg/dL — ABNORMAL HIGH (ref 70–99)
Potassium: 3.8 meq/L (ref 3.5–5.1)
Sodium: 138 meq/L (ref 135–145)
Total Bilirubin: 0.9 mg/dL (ref 0.2–1.2)
Total Protein: 6.6 g/dL (ref 6.0–8.3)

## 2024-03-21 LAB — CBC WITH DIFFERENTIAL/PLATELET
Basophils Absolute: 0.1 10*3/uL (ref 0.0–0.1)
Basophils Relative: 0.8 % (ref 0.0–3.0)
Eosinophils Absolute: 0.3 10*3/uL (ref 0.0–0.7)
Eosinophils Relative: 4.6 % (ref 0.0–5.0)
HCT: 39.4 % (ref 39.0–52.0)
Hemoglobin: 13.2 g/dL (ref 13.0–17.0)
Lymphocytes Relative: 16.5 % (ref 12.0–46.0)
Lymphs Abs: 1.1 10*3/uL (ref 0.7–4.0)
MCHC: 33.5 g/dL (ref 30.0–36.0)
MCV: 93.8 fl (ref 78.0–100.0)
Monocytes Absolute: 0.6 10*3/uL (ref 0.1–1.0)
Monocytes Relative: 8.7 % (ref 3.0–12.0)
Neutro Abs: 4.8 10*3/uL (ref 1.4–7.7)
Neutrophils Relative %: 69.4 % (ref 43.0–77.0)
Platelets: 272 10*3/uL (ref 150.0–400.0)
RBC: 4.2 Mil/uL — ABNORMAL LOW (ref 4.22–5.81)
RDW: 13.9 % (ref 11.5–15.5)
WBC: 6.9 10*3/uL (ref 4.0–10.5)

## 2024-03-21 MED ORDER — DONEPEZIL HCL 10 MG PO TABS: 10.0000 mg | ORAL_TABLET | Freq: Every day | ORAL | 1 refills | Status: DC

## 2024-03-21 NOTE — Assessment & Plan Note (Signed)
 Continue walker, home health PT/OT eval ordered today

## 2024-03-21 NOTE — Patient Instructions (Addendum)
 [email or web address]  I have also sent a home health referral for you today and someone should be reaching out to get you scheduled.   Refilled donepezil  today.

## 2024-03-21 NOTE — Assessment & Plan Note (Signed)
 Continue efforts in healthy diet and activity level

## 2024-03-21 NOTE — Assessment & Plan Note (Signed)
 Continue walker Home health PT/OT eval today

## 2024-03-21 NOTE — Assessment & Plan Note (Signed)
Refilled donepezil

## 2024-03-21 NOTE — Progress Notes (Signed)
 Acute Office Visit  Subjective:     Patient ID: Dennis Osborn, male    DOB: 11-Sep-1941, 83 y.o.   MRN: [id number]  Chief Complaint  Patient presents with   Hip Injury    Request home health and pt   Hospitalization Follow-up    Admitted 02/29/24 discharged 03/10/24     HPI Follow up Hospitalization  Patient was admitted to [facility] on 02/29/2024 and discharged on 03/10/2024. He was treated for right hip fracture with total hip replacement, subsequent right hip dislocation. Treatment for this included post dislocation bracing. Telephone follow up was done on 03/10/2024 He reports excellent compliance with treatment. He reports this condition is improved. He is accompanied by his daughter who is acting as historian  Daughter states that they received a letter in the mail from [facility] urology dated 03/02/2024 that he had a UTI.  He did not complete oral antibiotics.  Only received antibiotics IV in the hospital with surgery.  Requesting refill of donepezil . ----------------------------------------------------------------------------------------- [facility] Course:  #1 acute right subcapital femoral neck fracture after mechanical fall -Status post surgical intervention per orthopedics on 03/01/2024. - Patient seen by PT/OT who recommended SNF placement. - Patient maintained on pain regimen, DVT prophylaxis and wound care per orthopedics.  - Patient noted to have been medically stable, insurance denied SNF placement on 03/06/2024 and family have appealed. - Appeal process gone through and patient to be discharged to skilled nursing facility.  - Outpatient follow-up with orthopedics.    2.  Mechanical fall with periprosthetic hip dislocation -Status post closed reduction right hip per orthopedics, Dr. Charol Copas 03/03/2024. - Patient followed by PT/OT who recommended SNF placement. -Outpatient follow-up with orthopedics.   3.  Leukocytosis -Felt likely to be reactive and  noted to be trending down. -Resolved by day of discharge.   4.  Acute metabolic acidosis - Resolved.   5.  Hyperlipidemia - Patient maintained on statin.   ROS Per HPI      Objective:    BP 132/60 Comment: Repeat BP  Pulse 69   Temp 98.7 F (37.1 C) (Temporal)   Ht 5\' 8"  (1.727 m)   Wt 142 lb (64.4 kg)   SpO2 97%   BMI 21.59 kg/m    Physical Exam Vitals and nursing note reviewed.  Constitutional:      General: He is not in acute distress. HENT:     Head: Normocephalic and atraumatic.     Right Ear: External ear normal.     Left Ear: External ear normal.     Nose: Nose normal.     Mouth/Throat:     Mouth: Mucous membranes are moist.     Pharynx: Oropharynx is clear.  Eyes:     Extraocular Movements: Extraocular movements intact.  Neck:     Vascular: No carotid bruit.  Cardiovascular:     Rate and Rhythm: Normal rate and regular rhythm.     Pulses: Normal pulses.     Heart sounds: Normal heart sounds.  Pulmonary:     Effort: Pulmonary effort is normal. No respiratory distress.     Breath sounds: Normal breath sounds. No wheezing, rhonchi or rales.  Musculoskeletal:     Cervical back: Normal range of motion.     Right lower leg: No edema.     Left lower leg: No edema.     Comments: Using 2 wheeled walker, brace to right hip and right thigh in place  Lymphadenopathy:  Cervical: No cervical adenopathy.  Skin:    General: Skin is warm and dry.  Neurological:     General: No focal deficit present.     Mental Status: He is alert and oriented to person, place, and time.  Psychiatric:        Mood and Affect: Mood normal.        Behavior: Behavior normal.     No results found for any visits on 03/21/24.      Assessment & Plan:   Generalized weakness Assessment & Plan: Home health for PT/OT eval Continue walker  Orders: -     Ambulatory referral to Home Health  Limited mobility Assessment & Plan: Continue walker, home health PT/OT eval  ordered today  Orders: -     Ambulatory referral to Home Health  Leukocytosis, unspecified type Assessment & Plan: CBC today  Orders: -     CBC with Differential/Platelet  Metabolic acidosis Assessment & Plan: CBC, CMP today  Orders: -     Comprehensive metabolic panel with GFR  S/P hip replacement, right Assessment & Plan: Continue walker Home health PT/OT eval today  Orders: -     Ambulatory referral to Home Health  Acute cystitis without hematuria Assessment & Plan: Urine culture today to test for resolution of UTI  Orders: -     Urine Culture; Future  Memory problem Assessment & Plan: Refilled donepezil   Orders: -     Donepezil  HCl; Take 1 tablet (10 mg total) by mouth daily.  Dispense: 90 tablet; Refill: 1  BMI 21.0-21.9, adult Assessment & Plan: Continue efforts in healthy diet and activity level      Meds ordered this encounter  Medications   DISCONTD: donepezil  (ARICEPT ) 10 MG tablet    Sig: Take 1 tablet (10 mg total) by mouth daily.    Dispense:  90 tablet    Refill:  1   donepezil  (ARICEPT ) 10 MG tablet    Sig: Take 1 tablet (10 mg total) by mouth daily.    Dispense:  90 tablet    Refill:  1    Return if symptoms worsen or fail to improve, for As scheduled PCP.  Wellington Half, FNP

## 2024-03-21 NOTE — Assessment & Plan Note (Signed)
 Urine culture today to test for resolution of UTI

## 2024-03-21 NOTE — Assessment & Plan Note (Signed)
 CBC today.

## 2024-03-21 NOTE — Assessment & Plan Note (Signed)
 Home health for PT/OT eval Continue walker

## 2024-03-21 NOTE — Assessment & Plan Note (Signed)
-   CBC, CMP today.

## 2024-03-22 ENCOUNTER — Encounter: Payer: Self-pay | Admitting: Family Medicine

## 2024-03-23 ENCOUNTER — Ambulatory Visit: Payer: Self-pay | Admitting: Family Medicine

## 2024-03-23 DIAGNOSIS — N3000 Acute cystitis without hematuria: Secondary | ICD-10-CM

## 2024-03-23 LAB — URINE CULTURE

## 2024-03-23 MED ORDER — NITROFURANTOIN MONOHYD MACRO 100 MG PO CAPS: 100.0000 mg | ORAL_CAPSULE | Freq: Two times a day (BID) | ORAL | 0 refills | Status: AC

## 2024-03-30 ENCOUNTER — Telehealth: Payer: Self-pay | Admitting: Family Medicine

## 2024-03-30 NOTE — Telephone Encounter (Signed)
 LM for Dennis Osborn letting her know we can get PSA at his appt. Advised she call back and let us  know how she wants to receive those results

## 2024-03-30 NOTE — Telephone Encounter (Signed)
 Copied from CRM [id number]. Topic: Clinical - Request for Lab/Test Order >> Mar 30, 2024 10:36 AM Allyne Areola wrote: Reason for CRM: Dennis Osborn [phone] from [facility] is calling to see if patient can have a PSA lab test during his physical with Alyson Back on 04/06/2024. He is scheduled at their office for the lab test; however, they don't want him to get lab twice in once day and since his appointment with us  is earlier they were hoping it can be done at our office. Please call and advise.

## 2024-03-30 NOTE — Telephone Encounter (Signed)
 Copied from CRM [id number]. Topic: Clinical - Request for Lab/Test Order >> Mar 30, 2024  2:22 PM Dennis Osborn wrote: Reason for CRM: Dennis Osborn called from Dr bell office called to provide the fax number [phone] would like to add on a PSA test  please fax over the results

## 2024-04-06 ENCOUNTER — Ambulatory Visit: Admitting: Family Medicine

## 2024-04-06 ENCOUNTER — Telehealth: Payer: Self-pay | Admitting: Family Medicine

## 2024-04-06 ENCOUNTER — Encounter: Payer: Self-pay | Admitting: Family Medicine

## 2024-04-06 ENCOUNTER — Ambulatory Visit: Payer: Self-pay | Admitting: Family Medicine

## 2024-04-06 VITALS — BP 140/70 | HR 67 | Temp 98.0°F | Ht 68.0 in | Wt 142.0 lb

## 2024-04-06 DIAGNOSIS — R413 Other amnesia: Secondary | ICD-10-CM | POA: Diagnosis not present

## 2024-04-06 DIAGNOSIS — N3001 Acute cystitis with hematuria: Secondary | ICD-10-CM

## 2024-04-06 DIAGNOSIS — E785 Hyperlipidemia, unspecified: Secondary | ICD-10-CM

## 2024-04-06 DIAGNOSIS — N400 Enlarged prostate without lower urinary tract symptoms: Secondary | ICD-10-CM | POA: Insufficient documentation

## 2024-04-06 LAB — POCT URINALYSIS DIP (CLINITEK)
Bilirubin, UA: NEGATIVE
Glucose, UA: NEGATIVE mg/dL
Ketones, POC UA: NEGATIVE mg/dL
Nitrite, UA: NEGATIVE
Spec Grav, UA: 1.01 (ref 1.010–1.025)
Urobilinogen, UA: 0.2 U/dL
pH, UA: 8 (ref 5.0–8.0)

## 2024-04-06 LAB — BASIC METABOLIC PANEL WITH GFR
BUN: 20 mg/dL (ref 6–23)
CO2: 30 meq/L (ref 19–32)
Calcium: 9.6 mg/dL (ref 8.4–10.5)
Chloride: 102 meq/L (ref 96–112)
Creatinine, Ser: 0.94 mg/dL (ref 0.40–1.50)
GFR: 74.47 mL/min (ref >60.00)
Glucose, Bld: 103 mg/dL — ABNORMAL HIGH (ref 70–99)
Potassium: 4 meq/L (ref 3.5–5.1)
Sodium: 139 meq/L (ref 135–145)

## 2024-04-06 LAB — LIPID PANEL
Cholesterol: 159 mg/dL (ref 0–200)
HDL: 62.2 mg/dL (ref >39.00)
LDL Cholesterol: 76 mg/dL (ref 0–99)
NonHDL: 97.12
Total CHOL/HDL Ratio: 3
Triglycerides: 105 mg/dL (ref 0.0–149.0)
VLDL: 21 mg/dL (ref 0.0–40.0)

## 2024-04-06 LAB — CBC WITH DIFFERENTIAL/PLATELET
Basophils Absolute: 0.1 10*3/uL (ref 0.0–0.1)
Basophils Relative: 0.8 % (ref 0.0–3.0)
Eosinophils Absolute: 0.1 10*3/uL (ref 0.0–0.7)
Eosinophils Relative: 1.7 % (ref 0.0–5.0)
HCT: 40 % (ref 39.0–52.0)
Hemoglobin: 13.5 g/dL (ref 13.0–17.0)
Lymphocytes Relative: 16.9 % (ref 12.0–46.0)
Lymphs Abs: 1.3 10*3/uL (ref 0.7–4.0)
MCHC: 33.8 g/dL (ref 30.0–36.0)
MCV: 93 fl (ref 78.0–100.0)
Monocytes Absolute: 0.5 10*3/uL (ref 0.1–1.0)
Monocytes Relative: 5.8 % (ref 3.0–12.0)
Neutro Abs: 5.9 10*3/uL (ref 1.4–7.7)
Neutrophils Relative %: 74.8 % (ref 43.0–77.0)
Platelets: 214 10*3/uL (ref 150.0–400.0)
RBC: 4.29 Mil/uL (ref 4.22–5.81)
RDW: 13.8 % (ref 11.5–15.5)
WBC: 7.9 10*3/uL (ref 4.0–10.5)

## 2024-04-06 LAB — PSA, MEDICARE: PSA: 2.17 ng/mL (ref 0.10–4.00)

## 2024-04-06 MED ORDER — AMPICILLIN 500 MG PO CAPS
500.0000 mg | ORAL_CAPSULE | Freq: Three times a day (TID) | ORAL | 0 refills | Status: DC
Start: 2024-04-06 — End: 2024-07-25

## 2024-04-06 NOTE — Progress Notes (Signed)
 Subjective:     Patient ID: Dennis Osborn, male    DOB: 1940/11/01, 83 y.o.   MRN: [id number]  No chief complaint on file.   HPI  History of Present Illness         Here to follow up recent UTI. States he is till taking antibiotics prescribed on June 3rd however, he should have completed these before now. He is not sure of the medications he is taking.   His daughter in law is with him today.  She reports he is at his baseline mental status. States he gets angry easily.   States he lives alone and handles all of his medications.   States his urologist needs me to check his PSA.     Health Maintenance Due  Topic Date Due   Medicare Annual Wellness (AWV)  Never done   DTaP/Tdap/Td (1 - Tdap) Never done   Pneumococcal Vaccine: 50+ Years (1 of 1 - PCV) Never done   Zoster Vaccines- Shingrix (2 of 2) 01/26/2018    Past Medical History:  Diagnosis Date   Hyperlipidemia     Past Surgical History:  Procedure Laterality Date   APPENDECTOMY     HIP CLOSED REDUCTION Right 03/03/2024   Procedure: CLOSED MANIPULATION, JOINT, HIP;  Surgeon: Adonica Hoose, MD;  Location: [facility];  Service: Orthopedics;  Laterality: Right;   MINOR IRRIGATION AND DEBRIDEMENT OF WOUND Left 01/25/2018   Procedure: LEFT INDEX/LONG/RING FINGER IRRIGATION AND DEBRIDEMENT, REPAIR LACERATIONS, PINNING OF FRACTURES;  Surgeon: Brunilda Capra, MD;  Location: [facility];  Service: Orthopedics;  Laterality: Left;   TOTAL HIP 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 No pertinent family history.  Social History   Socioeconomic History   Marital status: Married    Spouse name: Not on file   Number of children: Not on file   Years of education: Not on file   Highest education level: Not on file  Occupational History   Not on file  Tobacco Use   Smoking  status: Never   Smokeless tobacco: Never  Substance and Sexual Activity   Alcohol  use: Never   Drug use: Not on file   Sexual activity: Not on file  Other Topics Concern   Not on file  Social History Narrative   Not on file   Social Drivers of Health   Financial Resource Strain: Not on file  Food Insecurity: No Food Insecurity (02/29/2024)   Hunger Vital Sign    Worried About Running Out of Food in the Last Year: Never true    Ran Out of Food in the Last Year: Never true  Transportation Needs: No Transportation Needs (02/29/2024)   PRAPARE - Administrator, Civil Service (Medical): No    Lack of Transportation (Non-Medical): No  Physical Activity: Not on file  Stress: Not on file  Social Connections: Unknown (02/27/2022)   Received from [facility]   Social Network    Social Network: Not on file  Intimate Partner Violence: Not At Risk (02/29/2024)   Humiliation, Afraid, Rape, and Kick questionnaire    Fear of Current or Ex-Partner: No    Emotionally Abused: No    Physically Abused: No    Sexually Abused: No    Outpatient Medications Prior to Visit  Medication Sig Dispense Refill   aspirin  (ASPIRIN  CHILDRENS) 81 MG chewable  tablet Chew 1 tablet (81 mg total) by mouth 2 (two) times daily with a meal. 90 tablet 0   Cholecalciferol  (VITAMIN D3 PO) Take 1 tablet by mouth daily.     donepezil  (ARICEPT ) 10 MG tablet Take 1 tablet (10 mg total) by mouth daily. 90 tablet 1   methocarbamol  (ROBAXIN ) 500 MG tablet Take 1 tablet (500 mg total) by mouth every 6 (six) hours as needed for muscle spasms. 20 tablet 0   Multiple Vitamin (MULTIVITAMIN WITH MINERALS) TABS tablet Take 1 tablet by mouth daily.     pantoprazole  (PROTONIX ) 40 MG tablet Take 1 tablet (40 mg total) by mouth daily.     polyethylene glycol (MIRALAX  / GLYCOLAX ) 17 g packet Take 17 g by mouth daily as needed for mild constipation.     rosuvastatin  (CRESTOR ) 10 MG tablet Take 10 mg by mouth at bedtime.      senna (SENOKOT) 8.6 MG TABS tablet Take 1 tablet (8.6 mg total) by mouth 2 (two) times daily.     No facility-administered medications prior to visit.    Allergies  Allergen Reactions   Oxycodone Other (See Comments)    Causes confusion     Review of Systems  Constitutional:  Negative for chills, fever and malaise/fatigue.  Respiratory:  Negative for shortness of breath.   Cardiovascular:  Negative for chest pain, palpitations and leg swelling.  Gastrointestinal:  Negative for abdominal pain, constipation, diarrhea, nausea and vomiting.  Genitourinary:  Negative for dysuria, frequency and urgency.  Musculoskeletal:  Negative for back pain and falls.  Neurological:  Negative for dizziness, focal weakness and headaches.  Psychiatric/Behavioral:  Positive for memory loss. Negative for depression. The patient is not nervous/anxious.        Objective:    Physical Exam Constitutional:      General: He is not in acute distress.    Appearance: He is not ill-appearing.  HENT:     Mouth/Throat:     Mouth: Mucous membranes are moist.     Pharynx: Oropharynx is clear.   Eyes:     Extraocular Movements: Extraocular movements intact.     Conjunctiva/sclera: Conjunctivae normal.    Cardiovascular:     Rate and Rhythm: Normal rate and regular rhythm.  Pulmonary:     Effort: Pulmonary effort is normal.     Breath sounds: Normal breath sounds.  Abdominal:     General: There is no distension.     Tenderness: There is no right CVA tenderness or left CVA tenderness.   Musculoskeletal:     Cervical back: Normal range of motion and neck supple.     Right lower leg: No edema.     Left lower leg: No edema.   Skin:    General: Skin is warm and dry.   Neurological:     General: No focal deficit present.     Mental Status: He is alert and oriented to person, place, and time.     Cranial Nerves: No cranial nerve deficit.     Motor: No weakness.     Coordination: Coordination normal.      Gait: Gait normal.   Psychiatric:        Mood and Affect: Mood normal.        Behavior: Behavior normal.      BP (!) 140/70 (BP Location: Left Arm, Patient Position: Sitting)   Pulse 67   Temp 98 F (36.7 C) (Temporal)   Ht 5' 8 (1.727 m)   Wt  142 lb (64.4 kg)   SpO2 98%   BMI 21.59 kg/m  Wt Readings from Last 3 Encounters:  04/06/24 142 lb (64.4 kg)  03/21/24 142 lb (64.4 kg)  03/03/24 160 lb 0.9 oz (72.6 kg)       Assessment & Plan:   Problem List Items Addressed This Visit     Acute cystitis with hematuria - Primary   Relevant Orders   Urine Culture   POCT URINALYSIS DIP (CLINITEK) (Completed)   CBC with Differential/Platelet (Completed)   Basic metabolic panel with GFR (Completed)   BPH without obstruction/lower urinary tract symptoms   Relevant Orders   PSA, Medicare ( [place] only) (Completed)   Hyperlipidemia   Relevant Orders   Amb Referral to Clinical Pharmacist   Lipid panel (Completed)   Other Visit Diagnoses       Memory change       Relevant Orders   Amb Referral to Clinical Pharmacist       UA shows blood and 3+leuks. Reviewed urine culture results from 2 weeks ago. Unclear if he completed course of Macrobid . Start ampicillin . Discussed case with Dr. Rochelle Chu.  Check PSA per patient request. He will follow up with urologist.  Continue statin. Check lipid panel.  Referral to clinical pharmacist for assistance with medications. He may benefit from pill pack.  Recommend 2 wk follow up due to some confusion regarding medications he is taking. Bring in bottles unless pharmacist gets meds organized for him before follow up  Visit time 25 minutes in face to face communication with patient and coordination of care, additional 10 minutes spent in record review, coordination or care, ordering tests, communicating/referring to other healthcare professionals, documenting in medical records all on the same day of the visit for total time 35  minutes spent on the visit.     I am having Anthonette Kinsman. Gloria Lares start on ampicillin . I am also having him maintain his multivitamin with minerals, Cholecalciferol  (VITAMIN D3 PO), rosuvastatin , aspirin , pantoprazole , polyethylene glycol, senna, methocarbamol , and donepezil .  Meds ordered this encounter  Medications   ampicillin  (PRINCIPEN) 500 MG capsule    Sig: Take 1 capsule (500 mg total) by mouth 3 (three) times daily. For UTI    Dispense:  15 capsule    Refill:  0    Supervising Provider:   Bambi Lever A [[id number]]

## 2024-04-06 NOTE — Telephone Encounter (Unsigned)
 Copied from CRM [id number]. Topic: General - Other >> Apr 06, 2024 11:39 AM Adonis Hoot wrote: Reason for CRM: Patient daughter was advised to call and give Alyson Back the name of medications that he's taking.  rosuvastatin  (CRESTOR ) 10 MG tablet  Escitalopram  5mg    donepezil  (ARICEPT ) 10 MG tablet   aspirin  (ASPIRIN  CHILDRENS) 81 MG chewable tablet  Cholecalciferol  (VITAMIN D3 PO)  Multiple Vitamin (MULTIVITAMIN WITH MINERALS) TABS tablet(centrum over 50)  Stool softner 50 mg comparable to Colace 2 a day

## 2024-04-06 NOTE — Patient Instructions (Signed)
 Please go downstairs for labs before you leave.  You still have a urinary tract infection.  I will send in an antibiotic to treat this.  Please call your urologist and schedule an appointment as soon as possible.  My pharmacist, Kathaleen Pale, will call you to help with medications.  I will be in touch with your results

## 2024-04-06 NOTE — Telephone Encounter (Unsigned)
 Copied from CRM [id number]. Topic: General - Other >> Apr 06, 2024 12:22 PM Baldo Levan wrote: Reason for CRM: Home Health is calling orders they faxed over, that they have not heard back about. They are going to try and re fax them today.

## 2024-04-09 LAB — URINE CULTURE

## 2024-04-09 NOTE — Progress Notes (Signed)
 Please let him know the following and ask him if he has scheduled a follow up visit with his urologist yet. If not, please ask him to do so.   Your urine culture shows that the antibiotic you are currently taking, Ampicillin , should take care of the infection

## 2024-04-10 ENCOUNTER — Other Ambulatory Visit: Payer: Self-pay | Admitting: Family Medicine

## 2024-04-11 NOTE — Telephone Encounter (Signed)
Forms placed on providers desk for signature.

## 2024-04-11 NOTE — Telephone Encounter (Signed)
 Faxed PSA labs to fax number provided. Received confirmation fax

## 2024-04-12 NOTE — Telephone Encounter (Signed)
 Medication list updated.

## 2024-04-13 ENCOUNTER — Telehealth: Payer: Self-pay

## 2024-04-13 NOTE — Telephone Encounter (Signed)
 Copied from CRM [id number]. Topic: Clinical - Lab/Test Results >> Apr 13, 2024  2:49 PM Dennis Osborn wrote: Reason for CRM: Patient called in regarding lab results, relay message, stated patient has appointment to urologist on Monday, Patient stated they understood and had no further questions

## 2024-04-14 NOTE — Telephone Encounter (Signed)
 Forms were signed and faxed 6/26

## 2024-04-20 ENCOUNTER — Encounter: Payer: Self-pay | Admitting: Family Medicine

## 2024-04-20 ENCOUNTER — Ambulatory Visit: Admitting: Family Medicine

## 2024-04-20 VITALS — BP 136/68 | HR 65 | Temp 97.9°F | Ht 68.0 in | Wt 136.0 lb

## 2024-04-20 DIAGNOSIS — N3001 Acute cystitis with hematuria: Secondary | ICD-10-CM

## 2024-04-20 DIAGNOSIS — F515 Nightmare disorder: Secondary | ICD-10-CM

## 2024-04-20 DIAGNOSIS — N400 Enlarged prostate without lower urinary tract symptoms: Secondary | ICD-10-CM | POA: Diagnosis not present

## 2024-04-20 DIAGNOSIS — E785 Hyperlipidemia, unspecified: Secondary | ICD-10-CM | POA: Diagnosis not present

## 2024-04-20 DIAGNOSIS — F419 Anxiety disorder, unspecified: Secondary | ICD-10-CM

## 2024-04-20 DIAGNOSIS — R413 Other amnesia: Secondary | ICD-10-CM

## 2024-04-20 LAB — POC URINALSYSI DIPSTICK (AUTOMATED)
Bilirubin, UA: NEGATIVE
Blood, UA: NEGATIVE
Glucose, UA: NEGATIVE
Ketones, UA: POSITIVE
Nitrite, UA: NEGATIVE
Protein, UA: POSITIVE — AB
Spec Grav, UA: 1.025 (ref 1.010–1.025)
Urobilinogen, UA: 0.2 U/dL
pH, UA: 6 (ref 5.0–8.0)

## 2024-04-20 MED ORDER — ESCITALOPRAM OXALATE 10 MG PO TABS: 10.0000 mg | ORAL_TABLET | Freq: Every day | ORAL | 1 refills | Status: DC

## 2024-04-20 NOTE — Progress Notes (Signed)
 Subjective:     Patient ID: Dennis Osborn, male    DOB: 08/19/1941, 83 y.o.   MRN: [id number]  Chief Complaint  Patient presents with   Medical Management of Chronic Issues    2 week f/u    HPI  History of Present Illness         He is here to follow up on chronic health conditions. His daughter is with him today.   No urinary symptoms. Completed course of Ampicillin . Upcoming urology appt.   Normal PSA test.   Anxiety-he has been off of Lexapro . He has a new bottle at home and wants to go back on it.   Daughter thinks we should stop the Aricept . She reports his mood is more of an issue than his memory.     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 This measures approximately 15 mm and shows cortical restricted diffusion as well as mild hemorrhage. There is cortical enhancement following contrast infusion indicating subacute timing.   No other acute fracture. There is generalized atrophy. Minimal white matter changes are present. No other areas of hemorrhage or mass lesion. Ventricle size is normal.   Vascular: Normal arterial flow voids.   Skull and upper cervical spine: No focal skeletal lesion.   Sinuses/Orbits: Retention cyst left maxillary sinus.  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   Generalized atrophy with minimal chronic microvascular ischemic change.   Electronically Signed: By: Carlin Gaskins M.D. On: 05/26/2020 16:18    Health Maintenance Due  Topic Date Due   Medicare Annual Wellness (AWV)  Never done   DTaP/Tdap/Td (1 - Tdap) Never done   Pneumococcal Vaccine: 50+ Years (1 of 1 - PCV) Never done   Zoster Vaccines- Shingrix (2 of 2) 01/26/2018    Past Medical History:  Diagnosis Date   Hyperlipidemia     Past Surgical History:  Procedure  Laterality Date   APPENDECTOMY     HIP CLOSED REDUCTION Right 03/03/2024   Procedure: CLOSED MANIPULATION, JOINT, HIP;  Surgeon: Fidel Rogue, MD;  Location: [facility];  Service: Orthopedics;  Laterality: Right;   MINOR IRRIGATION AND DEBRIDEMENT OF WOUND Left 01/25/2018   Procedure: LEFT INDEX/LONG/RING FINGER IRRIGATION AND DEBRIDEMENT, REPAIR LACERATIONS, PINNING OF FRACTURES;  Surgeon: Murrell Drivers, MD;  Location: [facility];  Service: Orthopedics;  Laterality: Left;   TOTAL HIP ARTHROPLASTY Right 03/01/2024   Procedure: ARTHROPLASTY, HIP, TOTAL, ANTERIOR APPROACH;  Surgeon: Fidel Rogue, MD;  Location: [facility];  Service: Orthopedics;  Laterality: Right;    History reviewed. No pertinent family history.  Social History   Socioeconomic History   Marital status: Married    Spouse name: Not on file   Number of children: Not on file   Years of education: Not on file   Highest education level: Not on file  Occupational History   Not on file  Tobacco Use   Smoking status: Never   Smokeless tobacco: Never  Substance and Sexual Activity   Alcohol  use: Never   Drug use: Not on file   Sexual activity: Not on file  Other Topics Concern   Not on file  Social History Narrative   Not on file   Social Drivers of Health   Financial Resource Strain: Not on file  Food Insecurity: No Food Insecurity (02/29/2024)  Hunger Vital Sign    Worried About Running Out of Food in the Last Year: Never true    Ran Out of Food in the Last Year: Never true  Transportation Needs: No Transportation Needs (02/29/2024)   PRAPARE - Administrator, Civil Service (Medical): No    Lack of Transportation (Non-Medical): No  Physical Activity: Not on file  Stress: Not on file  Social Connections: Unknown (02/27/2022)   [facility]   Social Network    Social Network: Not on file  Intimate Partner Violence: Not At Risk (02/29/2024)   Humiliation, Afraid, Rape, and Kick questionnaire     Fear of Current or Ex-Partner: No    Emotionally Abused: No    Physically Abused: No    Sexually Abused: No    Outpatient Medications Prior to Visit  Medication Sig Dispense Refill   ampicillin  (PRINCIPEN) 500 MG capsule Take 1 capsule (500 mg total) by mouth 3 (three) times daily. For UTI 15 capsule 0   Cholecalciferol  (VITAMIN D3 PO) Take 1 tablet by mouth daily.     docusate sodium  (COLACE) 50 MG capsule Take 50 mg by mouth 2 (two) times daily as needed.     methocarbamol  (ROBAXIN ) 500 MG tablet Take 1 tablet (500 mg total) by mouth every 6 (six) hours as needed for muscle spasms. 20 tablet 0   Multiple Vitamin (MULTIVITAMIN WITH MINERALS) TABS tablet Take 1 tablet by mouth daily.     pantoprazole  (PROTONIX ) 40 MG tablet Take 1 tablet (40 mg total) by mouth daily.     rosuvastatin  (CRESTOR ) 10 MG tablet Take 10 mg by mouth at bedtime.     senna (SENOKOT) 8.6 MG TABS tablet Take 1 tablet (8.6 mg total) by mouth 2 (two) times daily.     donepezil  (ARICEPT ) 10 MG tablet Take 1 tablet (10 mg total) by mouth daily. 90 tablet 1   escitalopram  (LEXAPRO ) 5 MG tablet Take 5 mg by mouth daily.     No facility-administered medications prior to visit.    Allergies  Allergen Reactions   Oxycodone Other (See Comments)    Causes confusion     Review of Systems  Constitutional:  Negative for chills, fever and malaise/fatigue.  HENT:  Positive for hearing loss.   Respiratory:  Negative for shortness of breath.   Cardiovascular:  Negative for chest pain, palpitations and leg swelling.  Gastrointestinal:  Negative for abdominal pain, constipation, diarrhea, nausea and vomiting.  Genitourinary:  Negative for dysuria, flank pain, frequency, hematuria and urgency.  Musculoskeletal:  Negative for falls.  Neurological:  Negative for dizziness, tingling, focal weakness, loss of consciousness and headaches.  Psychiatric/Behavioral:  Negative for depression, hallucinations and suicidal ideas. The  patient is nervous/anxious.        Nightmares        Objective:    Physical Exam Constitutional:      General: He is not in acute distress.    Appearance: He is not ill-appearing.  HENT:     Mouth/Throat:     Mouth: Mucous membranes are moist.     Pharynx: Oropharynx is clear.  Eyes:     Extraocular Movements: Extraocular movements intact.     Conjunctiva/sclera: Conjunctivae normal.  Cardiovascular:     Rate and Rhythm: Normal rate.  Pulmonary:     Effort: Pulmonary effort is normal.  Musculoskeletal:     Cervical back: Normal range of motion and neck supple.     Right lower leg:  No edema.     Left lower leg: No edema.  Skin:    General: Skin is warm and dry.  Neurological:     General: No focal deficit present.     Mental Status: He is alert and oriented to person, place, and time.     Motor: No weakness.     Coordination: Coordination normal.     Gait: Gait normal.  Psychiatric:        Mood and Affect: Mood normal.        Behavior: Behavior normal.        Thought Content: Thought content normal.      BP 136/68 (BP Location: Left Arm, Patient Position: Sitting)   Pulse 65   Temp 97.9 F (36.6 C) (Temporal)   Ht 5' 8 (1.727 m)   Wt 136 lb (61.7 kg)   SpO2 98%   BMI 20.68 kg/m  Wt Readings from Last 3 Encounters:  04/20/24 136 lb (61.7 kg)  04/06/24 142 lb (64.4 kg)  03/21/24 142 lb (64.4 kg)       Assessment & Plan:   Problem List Items Addressed This Visit     Acute cystitis with hematuria - Primary   Relevant Orders   POCT Urinalysis Dipstick (Automated) (Completed)   BPH without obstruction/lower urinary tract symptoms   Hyperlipidemia   Other Visit Diagnoses       Nightmares         Memory change         Anxiety       Relevant Medications   escitalopram  (LEXAPRO ) 10 MG tablet      UA improved after course of Ampicillin . Urine culture could not be sent due to lab downstairs being closed today. He has an upcoming urology appt. No  urinary complaints today or sign of infection.  He is having nightmares and feeling irritable lately. He will restart Lexapro  5 mg x 2 weeks and increase to 10 mg if doing well.  May stop Aricept  per request.  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 Unclear as to whether this was followed up or if he saw neurology. We discussed repeat imaging, neurology referral and watchful waiting.  They will let me know how his mood is doing after restarting Lexapro .  If no improvement, then I recommend referral to neurology, especially since we are stopping Aricept  today per their request.    I have discontinued Shontez R. Nale Rick's donepezil  and escitalopram . I am also having him start on escitalopram . Additionally, I am having him maintain his multivitamin with minerals, Cholecalciferol  (VITAMIN D3 PO), rosuvastatin , pantoprazole , senna, methocarbamol , ampicillin , and docusate sodium .  Meds ordered this encounter  Medications   escitalopram  (LEXAPRO ) 10 MG tablet    Sig: Take 1 tablet (10 mg total) by mouth daily.    Dispense:  90 tablet    Refill:  1    Supervising Provider:   ROLLENE NORRIS A [[id number]]

## 2024-04-20 NOTE — Patient Instructions (Signed)
 Your urine has improved.  Please follow-up with your urologist as scheduled.  Start escitalopram  (Lexapro ) 5 mg tomorrow morning.    Take 5 mg daily for the next 2 weeks and then you may increase to the 10 mg escitalopram  daily in 2 weeks.  This will be a new prescription.  I sent it to [facility] home delivery.  Okay to stop donepezil .  I will see you back in 3 months or sooner if needed.

## 2024-06-12 ENCOUNTER — Telehealth: Payer: Self-pay

## 2024-06-12 NOTE — Telephone Encounter (Signed)
 Copied from CRM #[id number]. Topic: Clinical - Medication Question >> Jun 12, 2024  4:31 PM Delon DASEN wrote: Reason for CRM: question about medication for memory- [phone]

## 2024-06-13 NOTE — Telephone Encounter (Signed)
 LM for pt to clarify what question he has regarding memory medication, provided office number

## 2024-06-13 NOTE — Telephone Encounter (Signed)
 Pt called back and states he saw a commercial for prevagen for memory and wants to know if Vickie recommends that to help? If not, does she recommend anything over the counter to help with memory? He states he will put things down at the house and forgets where he put them and just wants a little boost if he can to help any memory.

## 2024-06-14 NOTE — Telephone Encounter (Signed)
 Called pt and informed him it is ok to take medication as long as not allergic to any ingredients, pt verbalized understanding

## 2024-07-25 ENCOUNTER — Encounter: Payer: Self-pay | Admitting: Family Medicine

## 2024-07-25 ENCOUNTER — Ambulatory Visit: Admitting: Family Medicine

## 2024-07-25 VITALS — BP 116/70 | HR 66 | Temp 97.8°F | Ht 68.0 in | Wt 143.0 lb

## 2024-07-25 DIAGNOSIS — E785 Hyperlipidemia, unspecified: Secondary | ICD-10-CM

## 2024-07-25 DIAGNOSIS — S0181XA Laceration without foreign body of other part of head, initial encounter: Secondary | ICD-10-CM | POA: Diagnosis not present

## 2024-07-25 DIAGNOSIS — F419 Anxiety disorder, unspecified: Secondary | ICD-10-CM

## 2024-07-25 DIAGNOSIS — Z23 Encounter for immunization: Secondary | ICD-10-CM

## 2024-07-25 NOTE — Progress Notes (Signed)
 Subjective:     Patient ID: Dennis Osborn, male    DOB: [birth year], 83 y.o.   MRN: [id number]  Chief Complaint  Patient presents with   Medical Management of Chronic Issues    3 month f/u    HPI  Discussed the use of AI scribe software for clinical note transcription with the patient, who gave verbal consent to proceed.  History of Present Illness Dennis Osborn is an 83 year old male who presents for follow-up on chronic health issues and anxiety management. He is alone today.   Forehead laceration - Sustained a laceration on the forehead from a drill bit - Laceration has healed with a scab - No signs of infection - No loss of consciousness or headaches following the incident  Anxiety and cognitive symptoms - Takes Lexapro  for anxiety - Lexapro  helps with forgetfulness   Sleep patterns - Generally feels good and sleeps well - Occasionally stays up late  Immunization status and skin integrity - Recently received a flu shot  - Uncertain about tetanus vaccine status - Remains active and frequently sustains scratches     Health Maintenance Due  Topic Date Due   Medicare Annual Wellness (AWV)  Never done   Pneumococcal Vaccine: 50+ Years (1 of 1 - PCV) Never done   Zoster Vaccines- Shingrix (2 of 2) 01/26/2018    Past Medical History:  Diagnosis Date   Hyperlipidemia     Past Surgical History:  Procedure Laterality Date   APPENDECTOMY     HIP CLOSED REDUCTION Right 03/03/2024   Procedure: CLOSED MANIPULATION, JOINT, HIP;  Surgeon: Fidel Rogue, MD;  Location: [facility];  Service: Orthopedics;  Laterality: Right;   MINOR IRRIGATION AND DEBRIDEMENT OF WOUND Left 01/25/2018   Procedure: LEFT INDEX/LONG/RING FINGER IRRIGATION AND DEBRIDEMENT, REPAIR LACERATIONS, PINNING OF FRACTURES;  Surgeon: Murrell Drivers, MD;  Location: [facility];  Service: Orthopedics;  Laterality: Left;   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 No pertinent family history.  Social History   Socioeconomic History   Marital status: Married    Spouse name: Not on file   Number of children: Not on file   Years of education: Not on file   Highest education level: Not on file  Occupational History   Not on file  Tobacco Use   Smoking status: Never   Smokeless tobacco: Never  Substance and Sexual Activity   Alcohol  use: Never   Drug use: Not on file   Sexual activity: Not on file  Other Topics Concern   Not on file  Social History Narrative   Not on file   Social Drivers of Health   Financial Resource Strain: Not on file  Food Insecurity: No Food Insecurity (02/29/2024)   Hunger Vital Sign    Worried About Running Out of Food in the Last Year: Never true    Ran Out of Food in the Last Year: Never true  Transportation Needs: No Transportation Needs (02/29/2024)   PRAPARE - Administrator, Civil Service (Medical): No    Lack of Transportation (Non-Medical): No  Physical Activity: Not on file  Stress: Not on file  Social Connections: Unknown (02/27/2022)   [facility]   Social Network    Social Network: Not on file  Intimate Partner Violence: Not At Risk (  02/29/2024)   Humiliation, Afraid, Rape, and Kick questionnaire    Fear of Current or Ex-Partner: No    Emotionally Abused: No    Physically Abused: No    Sexually Abused: No    Outpatient Medications Prior to Visit  Medication Sig Dispense Refill   Cholecalciferol  (VITAMIN D3 PO) Take 1 tablet by mouth daily.     docusate sodium  (COLACE) 50 MG capsule Take 50 mg by mouth 2 (two) times daily as needed.     escitalopram  (LEXAPRO ) 10 MG tablet Take 1 tablet (10 mg total) by mouth daily. 90 tablet 1   methocarbamol  (ROBAXIN ) 500 MG tablet Take 1 tablet (500 mg total) by mouth every 6 (six) hours as needed for muscle  spasms. 20 tablet 0   Multiple Vitamin (MULTIVITAMIN WITH MINERALS) TABS tablet Take 1 tablet by mouth daily.     pantoprazole  (PROTONIX ) 40 MG tablet Take 1 tablet (40 mg total) by mouth daily.     rosuvastatin  (CRESTOR ) 10 MG tablet Take 10 mg by mouth at bedtime.     senna (SENOKOT) 8.6 MG TABS tablet Take 1 tablet (8.6 mg total) by mouth 2 (two) times daily.     ampicillin  (PRINCIPEN) 500 MG capsule Take 1 capsule (500 mg total) by mouth 3 (three) times daily. For UTI 15 capsule 0   No facility-administered medications prior to visit.    Allergies  Allergen Reactions   Oxycodone Other (See Comments)    Causes confusion     Review of Systems  Constitutional:  Negative for chills, fever, malaise/fatigue and weight loss.  Eyes:  Negative for blurred vision and double vision.  Respiratory:  Negative for cough and shortness of breath.   Cardiovascular:  Negative for chest pain, palpitations and leg swelling.  Gastrointestinal:  Negative for abdominal pain, constipation, diarrhea, nausea and vomiting.  Genitourinary:  Negative for dysuria, frequency and urgency.  Musculoskeletal:  Negative for back pain and falls.  Skin:        Superficial laceration of forehead and abrasion of right forearm   Neurological:  Negative for dizziness, focal weakness and headaches.  Psychiatric/Behavioral:  Negative for depression. The patient is not nervous/anxious.        Objective:    Physical Exam Constitutional:      General: He is not in acute distress.    Appearance: He is not ill-appearing.  HENT:     Head:     Comments: Healing superficial laceration on left forehead (see pic)    Mouth/Throat:     Mouth: Mucous membranes are moist.     Pharynx: Oropharynx is clear.  Eyes:     Extraocular Movements: Extraocular movements intact.     Conjunctiva/sclera: Conjunctivae normal.  Cardiovascular:     Rate and Rhythm: Normal rate and regular rhythm.  Pulmonary:     Effort: Pulmonary effort  is normal.     Breath sounds: Normal breath sounds.  Musculoskeletal:        General: Normal range of motion.     Cervical back: Normal range of motion and neck supple.     Right lower leg: No edema.     Left lower leg: No edema.  Skin:    General: Skin is warm and dry.  Neurological:     General: No focal deficit present.     Mental Status: He is alert and oriented to person, place, and time.     Motor: No weakness.     Coordination: Coordination normal.  Gait: Gait normal.  Psychiatric:        Mood and Affect: Mood normal.        Behavior: Behavior normal.        Thought Content: Thought content normal.      BP 116/70   Pulse 66   Temp 97.8 F (36.6 C) (Temporal)   Ht 5' 8 (1.727 m)   Wt 143 lb (64.9 kg)   SpO2 96%   BMI 21.74 kg/m  Wt Readings from Last 3 Encounters:  07/25/24 143 lb (64.9 kg)  04/20/24 136 lb (61.7 kg)  04/06/24 142 lb (64.4 kg)       Assessment & Plan:   Problem List Items Addressed This Visit     Hyperlipidemia - Primary   Other Visit Diagnoses       Anxiety         Laceration of forehead, initial encounter       Relevant Orders   Td vaccine greater than or equal to 7yo preservative free IM (Completed)     Need for Td vaccine       Relevant Orders   Td vaccine greater than or equal to 7yo preservative free IM (Completed)       Assessment and Plan Assessment & Plan Laceration of forehead Laceration sustained during woodworking. No loss of consciousness or headache. Healing with a scab, no signs of infection. - Administer tetanus vaccine due to unknown last Td, laceration and active lifestyle.  Anxiety Anxiety well-managed with escitalopram  (Lexapro ). Reports feeling less forgetful and generally good. Improved sleep with occasional disturbances. Continues medication despite cost concerns.  Hyperlipidemia - LDL 76 in June 2025  General Health Maintenance Tetanus immunization status discussed. Active lifestyle with frequent  minor injuries necessitates up-to-date vaccination. - Td updated   Follow-Up Preference for earlier appointments expressed. - Schedule next appointment at 8 AM as per preference.     I have discontinued Kelijah R. Grundman Rick's ampicillin . I am also having him maintain his multivitamin with minerals, Cholecalciferol  (VITAMIN D3 PO), rosuvastatin , pantoprazole , senna, methocarbamol , docusate sodium , and escitalopram .  No orders of the defined types were placed in this encounter.

## 2024-07-31 ENCOUNTER — Other Ambulatory Visit: Payer: Self-pay | Admitting: Family Medicine

## 2024-07-31 DIAGNOSIS — R413 Other amnesia: Secondary | ICD-10-CM

## 2024-08-01 NOTE — Telephone Encounter (Signed)
 Called pt and he reports he is not taking this medication at this time, may be auto from pharmacy. I will cancel this refill request

## 2024-08-07 ENCOUNTER — Telehealth: Payer: Self-pay

## 2024-08-07 NOTE — Telephone Encounter (Signed)
 Copied from CRM #[id number]. Topic: Clinical - Medication Question >> Aug 07, 2024  8:49 AM Mercedes MATSU wrote: Reason for CRM: Patient wants to leave a message for NP Boby Mackintosh, he states that he has been taking medication for his memory and they are rising the price from 28$ to 42$ @ [facility] and he is not going to be taking it anymore. He wanted to know if there was something else that can be prescribed that would be less expensive. Patient can be reached at [phone].

## 2024-08-08 NOTE — Telephone Encounter (Signed)
 LM for pt to return my call

## 2024-09-05 ENCOUNTER — Other Ambulatory Visit: Payer: Self-pay | Admitting: Family Medicine

## 2024-09-05 DIAGNOSIS — F419 Anxiety disorder, unspecified: Secondary | ICD-10-CM

## 2024-09-22 ENCOUNTER — Telehealth: Payer: Self-pay

## 2024-09-22 ENCOUNTER — Ambulatory Visit

## 2024-09-22 VITALS — Ht 68.0 in | Wt 143.0 lb

## 2024-09-22 DIAGNOSIS — Z0000 Encounter for general adult medical examination without abnormal findings: Secondary | ICD-10-CM

## 2024-09-22 NOTE — Progress Notes (Addendum)
 Chief Complaint  Patient presents with   Medicare Wellness     Subjective:  Please attest and cosign this visit due to patients primary care provider not being in the office at the time the visit was completed.  (Pt of Dennis Mackintosh, NP)  I connected with  Dennis Osborn on 09/22/24 by a audio enabled telemedicine application and verified that I am speaking with the correct person using two identifiers.  Patient Location: Home  Provider Location: Office/Clinic  Persons Participating in Visit: Patient.  I discussed the limitations of evaluation and management by telemedicine. The patient expressed understanding and agreed to proceed.  Vital Signs: Because this visit was a virtual/telehealth visit, some criteria may be missing or patient reported. Any vitals not documented were not able to be obtained and vitals that have been documented are patient reported.    Dennis Osborn is a 83 y.o. male who presents for a Medicare Annual Wellness Visit.  Visit info / Clinical Intake: Medicare Wellness Visit Type:: Subsequent Annual Wellness Visit Persons participating in visit and providing information:: patient Medicare Wellness Visit Mode:: Telephone If telephone:: video declined Since this visit was completed virtually, some vitals may be partially provided or unavailable. Missing vitals are due to the limitations of the virtual format.: Documented vitals are patient reported If Telephone or Video please confirm:: I connected with patient using audio/video enable telemedicine. I verified patient identity with two identifiers, discussed telehealth limitations, and patient agreed to proceed. Patient Location:: Home Provider Location:: Office Interpreter Needed?: No Pre-visit prep was completed: yes AWV questionnaire completed by patient prior to visit?: no Living arrangements:: (!) lives alone Patient's Overall Health Status Rating: good Typical amount of pain: none Does pain affect daily  life?: no Are you currently prescribed opioids?: no  Dietary Habits and Nutritional Risks How many meals a day?: 3 Eats fruit and vegetables daily?: yes Most meals are obtained by: preparing own meals; having others provide food In the last 2 weeks, have you had any of the following?: none Diabetic:: no  Functional Status Activities of Daily Living (to include ambulation/medication): Independent Ambulation: Independent with device- listed below Home Assistive Devices/Equipment: Eyeglasses Medication Administration: Independent Home Management (perform basic housework or laundry): Independent Manage your own finances?: yes Primary transportation is: driving Concerns about vision?: no *vision screening is required for WTM* Concerns about hearing?: (!) yes Uses hearing aids?: (!) yes Hear whispered voice?: yes  Fall Screening Falls in the past year?: 1 Number of falls in past year: 1 (2) Was there an injury with Fall?: 1 (right hip fracture) Fall Risk Category Calculator: 3 Patient Fall Risk Level: High Fall Risk  Fall Risk Patient at Risk for Falls Due to: History of fall(s); Impaired balance/gait Fall risk Follow up: Falls evaluation completed; Education provided  Home and Transportation Safety: All rugs have non-skid backing?: N/A, no rugs All stairs or steps have railings?: yes (outside) Grab bars in the bathtub or shower?: yes Have non-skid surface in bathtub or shower?: yes Good home lighting?: yes Regular seat belt use?: yes Hospital stays in the last year:: (!) yes How many hospital stays:: other (stayed 14 days) Reason: right hip surgery  Cognitive Assessment Difficulty concentrating, remembering, or making decisions? : no Will 6CIT or Mini Cog be Completed: no 6CIT or Mini Cog Declined: patient declined  Advance Directives (For Healthcare) Does Patient Have a Medical Advance Directive?: Yes Does patient want to make changes to medical advance directive?: Yes  (Inpatient - patient  requests chaplain consult to change a medical advance directive) Type of Advance Directive: Healthcare Power of [place]; Living will Copy of Healthcare Power of Attorney in Chart?: No - copy requested Copy of Living Will in Chart?: No - copy requested Would patient like information on creating a medical advance directive?: Yes (Inpatient - patient requests chaplain consult to create a medical advance directive)  Reviewed/Updated  Reviewed/Updated: Reviewed All (Medical, Surgical, Family, Medications, Allergies, Care Teams, Patient Goals)    Allergies (verified) Oxycodone   Current Medications (verified) Outpatient Encounter Medications as of 09/22/2024  Medication Sig   Cholecalciferol  (VITAMIN D3 PO) Take 1 tablet by mouth daily.   docusate sodium  (COLACE) 50 MG capsule Take 50 mg by mouth 2 (two) times daily as needed.   escitalopram  (LEXAPRO ) 10 MG tablet TAKE 1 TABLET BY MOUTH DAILY   methocarbamol  (ROBAXIN ) 500 MG tablet Take 1 tablet (500 mg total) by mouth every 6 (six) hours as needed for muscle spasms.   Multiple Vitamin (MULTIVITAMIN WITH MINERALS) TABS tablet Take 1 tablet by mouth daily.   pantoprazole  (PROTONIX ) 40 MG tablet Take 1 tablet (40 mg total) by mouth daily.   rosuvastatin  (CRESTOR ) 10 MG tablet Take 10 mg by mouth at bedtime.   senna (SENOKOT) 8.6 MG TABS tablet Take 1 tablet (8.6 mg total) by mouth 2 (two) times daily.   No facility-administered encounter medications on file as of 09/22/2024.    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 No pertinent family history. Social History   Occupational History   Not on file  Tobacco Use   Smoking status: Never   Smokeless tobacco: Never  Substance and Sexual Activity   Alcohol  use: Never   Drug use: Never   Sexual activity: Not Currently   Tobacco Counseling Counseling given: Not Answered  SDOH Screenings   Food Insecurity: No Food Insecurity (09/22/2024)  Housing: Unknown (09/22/2024)  Transportation Needs: No Transportation Needs (09/22/2024)  Utilities: Not At Risk (09/22/2024)  Depression (PHQ2-9): Low Risk  (09/22/2024)  Physical Activity: Insufficiently Active (09/22/2024)  Social Connections: Moderately Integrated (09/22/2024)  Stress: No Stress Concern Present (09/22/2024)  Tobacco Use: Low Risk  (09/22/2024)  Health Literacy: Adequate Health Literacy (09/22/2024)   See flowsheets for full screening details  Depression Screen PHQ 2 & 9 Depression Scale- Over the past 2 weeks, how often have you been bothered by any of the following problems? Little interest or pleasure in doing things: 0 Feeling down, depressed, or hopeless (PHQ Adolescent also includes...irritable): 0 PHQ-2 Total Score: 0 Trouble falling or staying asleep, or sleeping too much: 0 Feeling tired or having little energy: 0 Poor appetite or overeating (PHQ Adolescent also includes...weight loss): 0 Feeling bad about yourself - or that you are a failure or have let yourself or your family  down: 0 Trouble concentrating on things, such as reading the newspaper or watching television [facility] Adolescent also includes...like school work): 0 Moving or speaking so slowly that other  people could have noticed. Or the opposite - being so fidgety or restless that you have been moving around a lot more than usual: 0 Thoughts that you would be better off dead, or of hurting yourself in some way: 0 PHQ-9 Total Score: 0 If you checked off any problems, how difficult have these problems made it for you to do your work, take care of things at home, or get along with other people?: Not difficult at all  Depression Treatment Depression Interventions/Treatment : EYV7-0 Score <4 Follow-up Not Indicated     Goals Addressed               This Visit's Progress     Patient Stated (pt-stated)        Patient stated he plans to stay active - walking             Objective:    Today's Vitals   09/22/24 1011  Weight: 143 lb (64.9 kg)  Height: 5' 8 (1.727 m)   Body mass index is 21.74 kg/m.  Hearing/Vision screen Hearing Screening - Comments:: Wears hearing aids Vision Screening - Comments:: Wears rx glasses - up to date with routine eye exams [facility] Immunizations and Health Maintenance Health Maintenance  Topic Date Due   Pneumococcal Vaccine: 50+ Years (1 of 1 - PCV) Never done   COVID-19 Vaccine (3 - 2025-26 season) 06/19/2024   Zoster Vaccines- Shingrix (2 of 2) 12/21/2024 (Originally 01/26/2018)   Influenza Vaccine  01/16/2025 (Originally 05/19/2024)   Medicare Annual Wellness (AWV)  09/22/2025   DTaP/Tdap/Td (2 - Tdap) 07/25/2034   Meningococcal B Vaccine  Aged Out        Assessment/Plan:  This is a routine wellness examination for Dennis Osborn.  I have recommended that this patient have a immunization for Shingles but he declines at this time. I have discussed the risks and benefits of this procedure with him. The patient verbalizes understanding.   Cognitive Test - pt declined but is alert and oriented.   Patient Care Team: Lendia Dennis CROME, NP-C as PCP - General (Family Medicine) Loni Soyla LABOR, MD as PCP - Cardiology (Cardiology)  I have  personally reviewed and noted the following in the patient's chart:   Medical and social history Use of alcohol , tobacco or illicit drugs  Current medications and supplements including opioid prescriptions. Functional ability and status Nutritional status Physical activity Advanced directives List of other physicians Hospitalizations, surgeries, and ER visits in previous 12 months Vitals Screenings to include cognitive, depression, and falls Referrals and appointments  No orders of the defined types were placed in this encounter.  In addition, I have reviewed and discussed with patient certain preventive protocols, quality metrics, and best practice recommendations. A written personalized care plan for preventive services as well as general preventive health recommendations were provided to patient.   Dennis Osborn, CMA   09/22/2024   Return in 1 year (on 09/22/2025).  After Visit Summary: (Declined) Due to this being a telephonic visit, with patients personalized plan was offered to patient but patient Declined AVS at this time   Nurse Notes: Pt stated will get Pneumonia vaccine at 03/2025 appt w/PCP.  Scheduled 2026 AWV appt

## 2024-09-22 NOTE — Telephone Encounter (Signed)
 Pt of Boby Mackintosh, NP  Pt is requesting a return phonecall regarding refills to mail-in pharmacy

## 2024-09-22 NOTE — Patient Instructions (Addendum)
 Dennis Osborn,  Thank you for taking the time for your Medicare Wellness Visit. I appreciate your continued commitment to your health goals. Please review the care plan we discussed, and feel free to reach out if I can assist you further.  Please note that Annual Wellness Visits do not include a physical exam. Some assessments may be limited, especially if the visit was conducted virtually. If needed, we may recommend an in-person follow-up with your provider.  Ongoing Care Seeing your primary care provider every 3 to 6 months helps us  monitor your health and provide consistent, personalized care.   Referrals If a referral was made during today's visit and you haven't received any updates within two weeks, please contact the referred provider directly to check on the status.  Recommended Screenings:  Health Maintenance  Topic Date Due   Pneumococcal Vaccine for age over 45 (1 of 1 - PCV) Never done   Zoster (Shingles) Vaccine (2 of 2) 01/26/2018   COVID-19 Vaccine (3 - 2025-26 season) 06/19/2024   Flu Shot  01/16/2025*   Medicare Annual Wellness Visit  09/22/2025   DTaP/Tdap/Td vaccine (2 - Tdap) 07/25/2034   Meningitis B Vaccine  Aged Out  *Topic was postponed. The date shown is not the original due date.       09/22/2024   10:15 AM  Advanced Directives  Does Patient Have a Medical Advance Directive? Yes  Type of Estate Agent of [place];Living will  Does patient want to make changes to medical advance directive? Yes (Inpatient - patient requests chaplain consult to change a medical advance directive)  Copy of Healthcare Power of Attorney in Chart? No - copy requested  Would patient like information on creating a medical advance directive? Yes (Inpatient - patient requests chaplain consult to create a medical advance directive)    Vision: Annual vision screenings are recommended for early detection of glaucoma, cataracts, and diabetic retinopathy. These exams can  also reveal signs of chronic conditions such as diabetes and high blood pressure.  Dental: Annual dental screenings help detect early signs of oral cancer, gum disease, and other conditions linked to overall health, including heart disease and diabetes.

## 2024-09-25 NOTE — Telephone Encounter (Signed)
 Spoke w pt and he states he didn't need a return call as he got a call from [facility] last month about about seeing if he needed a refill of medication. I advised pt we only prescribe his lexapro  and this was sent in for him. Pt verbalized understanding

## 2024-09-25 NOTE — Telephone Encounter (Signed)
 LM for pt to return call and let us  know what is needed.

## 2024-10-18 ENCOUNTER — Other Ambulatory Visit: Payer: Self-pay | Admitting: Family Medicine

## 2024-10-18 DIAGNOSIS — R413 Other amnesia: Secondary | ICD-10-CM

## 2024-11-24 ENCOUNTER — Other Ambulatory Visit: Payer: Self-pay | Admitting: Family Medicine

## 2024-11-24 ENCOUNTER — Telehealth: Payer: Self-pay

## 2024-11-24 DIAGNOSIS — R413 Other amnesia: Secondary | ICD-10-CM

## 2024-11-24 MED ORDER — DONEPEZIL HCL 10 MG PO TABS: 10.0000 mg | ORAL_TABLET | Freq: Every day | ORAL | 1 refills | Status: AC

## 2024-11-24 NOTE — Telephone Encounter (Signed)
 Rx sent.

## 2024-11-24 NOTE — Telephone Encounter (Signed)
 Copied from CRM [id number]. Topic: Clinical - Medication Question >> Nov 24, 2024 11:18 AM Deaijah H wrote: Reason for CRM: Patient would like to get prescription Donepezil  refilled did advise of refusal reason patient stated he's been taking it for a long time and if V. Lendia thinks he need it then he would like it refilled and would also like to speak with her or her assistant. Please call.

## 2024-11-24 NOTE — Addendum Note (Signed)
 Addended by: Makoto Sellitto E on: 11/24/2024 01:41 PM   Modules accepted: Orders

## 2024-11-24 NOTE — Telephone Encounter (Signed)
 Called pt as per 10/14 encounter pt stated he stopped taking this medication. After calling pt, he does not remember ever stopping this and doesn't remember telling me that. States he has been taking it regularly and would like a refill if Vickie still thinks this is appropriate. Please advise on refill

## 2025-04-10 ENCOUNTER — Encounter: Admitting: Family Medicine

## 2025-09-24 ENCOUNTER — Ambulatory Visit
